# Patient Record
Sex: Female | Born: 1965 | Race: White | Hispanic: No | Smoking: Current every day smoker
Health system: Southern US, Community
[De-identification: ages and names within clinical notes are randomized; demographics above are authoritative.]

## PROBLEM LIST (undated history)

## (undated) DIAGNOSIS — G43909 Migraine, unspecified, not intractable, without status migrainosus: Secondary | ICD-10-CM

## (undated) HISTORY — PX: TUBAL LIGATION: SHX77

---

## 2014-07-04 ENCOUNTER — Emergency Department
Admission: EM | Admit: 2014-07-04 | Discharge: 2014-07-04 | Disposition: A | Discharge status: Home / Self Care | Payer: PRIVATE HEALTH INSURANCE | Source: Home / Self Care | Attending: Emergency Medicine | Admitting: Emergency Medicine

## 2014-07-04 ENCOUNTER — Emergency Department (INDEPENDENT_AMBULATORY_CARE_PROVIDER_SITE_OTHER): Payer: PRIVATE HEALTH INSURANCE

## 2014-07-04 ENCOUNTER — Encounter: Payer: Self-pay | Admitting: *Deleted

## 2014-07-04 DIAGNOSIS — S8391XA Sprain of unspecified site of right knee, initial encounter: Secondary | ICD-10-CM | POA: Diagnosis not present

## 2014-07-04 DIAGNOSIS — M25561 Pain in right knee: Secondary | ICD-10-CM

## 2014-07-04 MED ORDER — ONDANSETRON HCL 4 MG PO TABS: 4.0000 mg | ORAL_TABLET | Freq: Once | ORAL | Status: DC

## 2014-07-04 NOTE — ED Notes (Signed)
Pt has previous tear in R knee 15 years ago.  Yesterday she was walking and felt a pop in R knee and has pain bearing weight.  Pain 7/10.  She has an old knee brace and is using crutches.

## 2014-07-04 NOTE — ED Provider Notes (Signed)
CSN: [id number]     Arrival date & time 07/04/14  1248 History   First MD Initiated Contact with Patient 07/04/14 1300     Chief Complaint  Patient presents with  . Knee Pain    R   Here with husband. 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 They present to [facility] urgent care  Sunday afternoon HPI She states she's had intermittent right knee pain for years. Then, while walking last week felt increased right knee pain diffusely but she continued her regular activity. Then, while walking, felt a pop and sharp pain right medial knee associated with swelling. She's tried knee brace and ice. Still painful. Husband brings her in. The right knee pain is medial, sharp, 7 out of 10 with attempts at weightbearing and movement. No radiation or paresthesias. No cardiorespiratory symptoms. History reviewed. No pertinent past medical history. Past Surgical History  Procedure Laterality Date  . Tubal ligation     History reviewed. No pertinent family history. History  Substance Use Topics  . Smoking status: Current Every Day Smoker -- 0.50 packs/day    Types: Cigarettes  . Smokeless tobacco: Current User  . Alcohol Use: Yes   OB History    No data available     Review of Systems  All other systems reviewed and are negative.   Allergies  Penicillins  Home Medications   Prior to Admission medications   Not on File   BP 105/67 mmHg  Pulse 69  Temp(Src) 97.4 F (36.3 C) (Oral)  Ht  (1.702 m)  Wt 160 lb (72.576 kg)  BMI 25.05 kg/m2  SpO2 97% Physical Exam  Constitutional: She is oriented to person, place, and time. She appears well-developed and well-nourished. No distress.  HENT:  Head: Normocephalic and atraumatic.  Eyes: Conjunctivae and EOM are normal. Pupils are equal, round, and reactive to light. No scleral icterus.  Neck: Normal range of motion.  Cardiovascular: Normal rate.   Pulmonary/Chest: Effort normal.  Abdominal: She exhibits no  distension.  Musculoskeletal:  See below  Neurological: She is alert and oriented to person, place, and time.  Skin: Skin is warm.  Psychiatric: She has a normal mood and affect.  Nursing note and vitals reviewed.  Musculoskeletal: Very tender swollen right knee medial aspect, mild ecchymosis. Decreased range of motion. The knee is too tender and too much pain to adequately check for stability. anterior drawer sign equivocal. Unable to perform McMurray's because of pain. Neurovascular distally intact. ED Course  Procedures (including critical care time) Labs Review Labs Reviewed - No data to display  Imaging Review Dg Knee Complete 4 Views Right  07/04/2014   CLINICAL DATA:  49 year old female with right knee pain and decreased mobility after spontaneous pop. No known injury.  EXAM: RIGHT KNEE - COMPLETE 4+ VIEW  COMPARISON:  None.  FINDINGS: There is no evidence of fracture, dislocation, or joint effusion. There is no evidence of arthropathy or other focal bone abnormality. Soft tissues are unremarkable.  IMPRESSION: Negative.   Electronically Signed   By: Malachy Moan M.D.   On: 07/04/2014 14:34     MDM   1. Sprain of right knee, initial encounter    severe sprain right knee. Could be medial collateral ligament sprain or tear. Possible medial meniscus sprain or tear. Explained to patient and husband at length. Questions invited and answered. Treatment options discussed, as well as risks, benefits, alternatives. They voiced understanding and agreement with the following plans:  She declined  prescription pain med, as she prefers Tylenol or ibuprofen New Crutches provided.--No weightbearing right knee until see orthopedist this week She has a supportive right knee brace that she brings in, so advised her to continue with the right knee brace until she sees orthopedist this week. Patient and husband voiced understanding and agreement Precautions discussed. Red flags  discussed. Questions invited and answered. They voiced understanding and agreement.  Addendum, patient mentions that she took a tramadol that she had at home and that relieved the pain somewhat, however she feels that cause nausea. Therefore, we treated her with Zofran ODT 8 mg SL stat here in urgent care. That relieved her nausea . She declined a prescription for Zofran.    Lajean Manesavid Massey, MD 07/04/14 2220

## 2015-04-05 ENCOUNTER — Emergency Department (INDEPENDENT_AMBULATORY_CARE_PROVIDER_SITE_OTHER): Payer: BLUE CROSS/BLUE SHIELD

## 2015-04-05 ENCOUNTER — Encounter: Payer: Self-pay | Admitting: *Deleted

## 2015-04-05 ENCOUNTER — Emergency Department
Admission: EM | Admit: 2015-04-05 | Discharge: 2015-04-05 | Disposition: A | Discharge status: Home / Self Care | Payer: BLUE CROSS/BLUE SHIELD | Source: Home / Self Care | Attending: Family Medicine | Admitting: Family Medicine

## 2015-04-05 DIAGNOSIS — R51 Headache: Secondary | ICD-10-CM | POA: Diagnosis not present

## 2015-04-05 DIAGNOSIS — J3489 Other specified disorders of nose and nasal sinuses: Secondary | ICD-10-CM

## 2015-04-05 DIAGNOSIS — R519 Headache, unspecified: Secondary | ICD-10-CM

## 2015-04-05 DIAGNOSIS — R110 Nausea: Secondary | ICD-10-CM

## 2015-04-05 HISTORY — DX: Migraine, unspecified, not intractable, without status migrainosus: G43.909

## 2015-04-05 LAB — POCT CBC W AUTO DIFF (K'VILLE URGENT CARE)

## 2015-04-05 MED ORDER — KETOROLAC TROMETHAMINE 60 MG/2ML IM SOLN
60.0000 mg | Freq: Once | INTRAMUSCULAR | Status: AC
  Administered 2015-04-05: 60 mg via INTRAMUSCULAR

## 2015-04-05 MED ORDER — CLINDAMYCIN HCL 300 MG PO CAPS: 300.0000 mg | ORAL_CAPSULE | Freq: Three times a day (TID) | ORAL | Status: AC

## 2015-04-05 MED ORDER — ONDANSETRON 4 MG PO TBDP
4.0000 mg | ORAL_TABLET | Freq: Once | ORAL | Status: AC
  Administered 2015-04-05: 4 mg via ORAL

## 2015-04-05 MED ORDER — ONDANSETRON 4 MG PO TBDP: ORAL_TABLET | ORAL | Status: AC

## 2015-04-05 NOTE — Discharge Instructions (Signed)
May take Ibuprofen 200mg , 4 tabs every 8 hours with food as needed for pain. If cold like symptoms develop, try the following: Take plain guaifenesin (1200mg  extended release tabs such as Mucinex) twice daily, with plenty of water, for cough and congestion.  May add Pseudoephedrine (30mg , one or two every 4 to 6 hours) for sinus congestion.  Get adequate rest.   May use Afrin nasal spray (or generic oxymetazoline) twice daily for about 5 days and then discontinue.  Also recommend using saline nasal spray several times daily and saline nasal irrigation (AYR is a common brand).   Try warm salt water gargles for sore throat.  Stop all antihistamines for now, and other non-prescription cough/cold preparations. May take Delsym Cough Suppressant at bedtime for nighttime cough.  Follow-up with family doctor if not improving about10 days.  If symptoms become significantly worse during the night or over the weekend, proceed to the local emergency room.

## 2015-04-05 NOTE — ED Notes (Signed)
Pt c/o LT sided HA and post nasal drip x 4 days. Hx of migraine and sinusitis. She started Amoxicillin on 04/02/15. She reports Tylenol sinus helps.

## 2015-04-05 NOTE — ED Provider Notes (Signed)
CSN: [id number]     Arrival date & time 04/05/15  1746 History   First MD Initiated Contact with Patient 04/05/15 1757     Chief Complaint  Patient presents with  . Headache      HPI Comments: Patient developed onset of recurrent typical migraine headache 5 days ago that has persisted.  She has also developed mild sore throat, fatigue, chills/sweats, and nausea.  She has had vague pain in her left upper teeth and gingiva.  She had left-over amoxicillin that she began taking 3 days ago without improvement.  The history is provided by the patient.    Past Medical History  Diagnosis Date  . Migraine    Past Surgical History  Procedure Laterality Date  . Tubal ligation    . Tubal ligation     History reviewed. No pertinent family history. Social History  Substance Use Topics  . Smoking status: Current Every Day Smoker -- 1.00 packs/day    Types: Cigarettes  . Smokeless tobacco: Current User  . Alcohol Use: Yes   OB History    No data available     Review of Systems No sore throat No cough + sneezing No pleuritic pain No wheezing + nasal congestion + post-nasal drainage + sinus pain/pressure No itchy/red eyes ? earache No hemoptysis No SOB No fever, + chills/sweats + nausea No vomiting No abdominal pain No diarrhea No urinary symptoms No skin rash + fatigue No myalgias + headache No neurologic symptoms  Used OTC meds without relief  Allergies  Penicillins  Home Medications   Prior to Admission medications   Medication Sig Start Date End Date Taking? Authorizing Provider  clindamycin (CLEOCIN) 300 MG capsule Take 1 capsule (300 mg total) by mouth 3 (three) times daily. 04/05/15   Lattie HawStephen A Allyana Vogan, MD  ondansetron (ZOFRAN ODT) 4 MG disintegrating tablet Take one tab by mouth Q6hr prn nausea 04/05/15   Lattie HawStephen A Savita Runner, MD   Meds Ordered and Administered this Visit   Medications  ketorolac (TORADOL) injection 60 mg (60 mg Intramuscular Given 04/05/15  1921)  ondansetron (ZOFRAN-ODT) disintegrating tablet 4 mg (4 mg Oral Given 04/05/15 1922)    BP 124/75 mmHg  Pulse 65  Temp(Src) 97.8 F (36.6 C) (Oral)  Resp 16  Ht 5\' 8"  (1.727 m)  Wt 164 lb (74.39 kg)  BMI 24.94 kg/m2  SpO2 100%  LMP 03/15/2015 No data found.   Physical Exam Nursing notes and Vital Signs reviewed. Appearance:  Patient appears uncomfortable and stated age, but in no acute distress Eyes:  Pupils are equal, round, and reactive to light and accomodation.  Extraocular movement is intact.  Conjunctivae are not inflamed.  Fundi benign  Ears:  Canals normal.  Tympanic membranes normal.  Nose:  Congested turbinates.  Left maxillary sinus tenderness is present.   Mouth:  Left upper 4th molar absent, and gingiva at that location has tenderness to palpation  Pharynx:  Normal Neck:  Supple.  Tender enlarged posterior nodes are palpated bilaterally  Lungs:  Clear to auscultation.  Breath sounds are equal.  Moving air well. Heart:  Regular rate and rhythm without murmurs, rubs, or gallops.  Abdomen:  Nontender without masses or hepatosplenomegaly.  Bowel sounds are present.  No CVA or flank tenderness.  Extremities:  No edema.  No calf tenderness Skin:  No rash present.  Neurologic:  Cranial nerves 2 through 12 are normal.  Patellar, achilles, and elbow reflexes are normal.  Cerebellar function is intact (finger-to-nose and  rapid alternating hand movement).  Gait and station are normal.     ED Course  Procedures none     Labs Reviewed  POCT CBC W AUTO DIFF ([facility]):  WBC 12.1; LY 23.6; MO 2.8; GR 73.6; Hgb 13.9; Platelets 222     Imaging Review Dg Sinuses Complete  04/05/2015  CLINICAL DATA:  Left-sided facial pain for 5 days EXAM: PARANASAL SINUSES - COMPLETE 3 + VIEW COMPARISON:  None. FINDINGS: Frontal, water's, lateral, and submentovertex images were obtained. Paranasal sinuses are clear. No air-fluid level. No bony destruction or expansion. Mastoids  also appear clear. There is slight leftward deviation of the nasal septum. IMPRESSION: Paranasal sinuses clear.  Slight leftward deviation of nasal septum. Electronically Signed   By: Bretta Bang III M.D.   On: 04/05/2015 18:45     MDM   1. Acute nonintractable headache, unspecified headache type; Note mild leukocytosis: ?dental source, ?early viral URI   2. Nausea without vomiting    Administered Toradol  IM, and Zofran ODT  po Begin empiric Clindamycin  TID for dental coverage. Followup with dentist in about two days. May take Ibuprofen , 4 tabs every 8 hours with food as needed for pain. If cold like symptoms develop, try the following: Take plain guaifenesin (  extended release tabs such as Mucinex) twice daily, with plenty of water, for cough and congestion.  May add Pseudoephedrine ( , one or two every 4 to 6 hours) for sinus congestion.  Get adequate rest.   May use Afrin nasal spray (or generic oxymetazoline) twice daily for about 5 days and then discontinue.  Also recommend using saline nasal spray several times daily and saline nasal irrigation (AYR is a common brand).   Try warm salt water gargles for sore throat.  Stop all antihistamines for now, and other non-prescription cough/cold preparations. May take Delsym Cough Suppressant at bedtime for nighttime cough.  Follow-up with family doctor if not improving about10 days.  If symptoms become significantly worse during the night or over the weekend, proceed to the local emergency room.     Lattie Haw, MD 04/13/15 1048

## 2015-10-07 ENCOUNTER — Emergency Department
Admission: EM | Admit: 2015-10-07 | Discharge: 2015-10-07 | Disposition: A | Discharge status: Home / Self Care | Payer: BLUE CROSS/BLUE SHIELD | Source: Home / Self Care

## 2015-10-07 ENCOUNTER — Encounter: Payer: Self-pay | Admitting: Emergency Medicine

## 2015-10-07 ENCOUNTER — Emergency Department (INDEPENDENT_AMBULATORY_CARE_PROVIDER_SITE_OTHER): Payer: BLUE CROSS/BLUE SHIELD

## 2015-10-07 DIAGNOSIS — M4180 Other forms of scoliosis, site unspecified: Secondary | ICD-10-CM | POA: Diagnosis not present

## 2015-10-07 DIAGNOSIS — M5136 Other intervertebral disc degeneration, lumbar region: Secondary | ICD-10-CM

## 2015-10-07 DIAGNOSIS — M545 Low back pain, unspecified: Secondary | ICD-10-CM

## 2015-10-07 LAB — POCT URINALYSIS DIP (MANUAL ENTRY)
GLUCOSE UA: NEGATIVE
NITRITE UA: NEGATIVE
PH UA: 5.5 (ref 5–8)
Protein Ur, POC: 30 — AB
Urobilinogen, UA: 1 (ref 0–1)

## 2015-10-07 MED ORDER — METHYLPREDNISOLONE SODIUM SUCC 125 MG IJ SOLR
80.0000 mg | Freq: Once | INTRAMUSCULAR | Status: AC
  Administered 2015-10-07: 80 mg via INTRAMUSCULAR

## 2015-10-07 MED ORDER — PREDNISONE 20 MG PO TABS: ORAL_TABLET | ORAL | Status: AC

## 2015-10-07 MED ORDER — ONDANSETRON HCL 4 MG PO TABS
4.0000 mg | ORAL_TABLET | Freq: Once | ORAL | Status: AC
  Administered 2015-10-07: 4 mg via ORAL

## 2015-10-07 MED ORDER — HYDROCODONE-ACETAMINOPHEN 5-325 MG PO TABS: 1.0000 | ORAL_TABLET | Freq: Four times a day (QID) | ORAL | Status: AC | PRN

## 2015-10-07 NOTE — ED Notes (Addendum)
Pt c/o back pain that is only on the left side. Started suddenly this am. No known injury. Denies urinary sxs but states some intermittent nausea.

## 2015-10-07 NOTE — Discharge Instructions (Signed)
Apply ice pack for 20 to 30 minutes, 3 to 4 times daily  Continue until pain decreases.  Begin back exercises as tolerated   Back Pain, Adult Back pain is very common in adults.The cause of back pain is rarely dangerous and the pain often gets better over time.The cause of your back pain may not be known. Some common causes of back pain include: 1. Strain of the muscles or ligaments supporting the spine. 2. Wear and tear (degeneration) of the spinal disks. 3. Arthritis. 4. Direct injury to the back. For many people, back pain may return. Since back pain is rarely dangerous, most people can learn to manage this condition on their own. HOME CARE INSTRUCTIONS Watch your back pain for any changes. The following actions may help to lessen any discomfort you are feeling: 1. Remain active. It is stressful on your back to sit or stand in one place for long periods of time. Do not sit, drive, or stand in one place for more than 30 minutes at a time. Take short walks on even surfaces as soon as you are able.Try to increase the length of time you walk each day. 2. Exercise regularly as directed by your health care provider. Exercise helps your back heal faster. It also helps avoid future injury by keeping your muscles strong and flexible. 3. Do not stay in bed.Resting more than 1-2 days can delay your recovery. 4. Pay attention to your body when you bend and lift. The most comfortable positions are those that put less stress on your recovering back. Always use proper lifting techniques, including: 1. Bending your knees. 2. Keeping the load close to your body. 3. Avoiding twisting. 5. Find a comfortable position to sleep. Use a firm mattress and lie on your side with your knees slightly bent. If you lie on your back, put a pillow under your knees. 6. Avoid feeling anxious or stressed.Stress increases muscle tension and can worsen back pain.It is important to recognize when you are anxious or stressed  and learn ways to manage it, such as with exercise. 7. Take medicines only as directed by your health care provider. Over-the-counter medicines to reduce pain and inflammation are often the most helpful.Your health care provider may prescribe muscle relaxant drugs.These medicines help dull your pain so you can more quickly return to your normal activities and healthy exercise. 8. Apply ice to the injured area: 1. Put ice in a plastic bag. 2. Place a towel between your skin and the bag. 3. Leave the ice on for 20 minutes, 2-3 times a day for the first 2-3 days. After that, ice and heat may be alternated to reduce pain and spasms. 9. Maintain a healthy weight. Excess weight puts extra stress on your back and makes it difficult to maintain good posture. SEEK MEDICAL CARE IF: 1. You have pain that is not relieved with rest or medicine. 2. You have increasing pain going down into the legs or buttocks. 3. You have pain that does not improve in one week. 4. You have night pain. 5. You lose weight. 6. You have a fever or chills. SEEK IMMEDIATE MEDICAL CARE IF:  1. You develop new bowel or bladder control problems. 2. You have unusual weakness or numbness in your arms or legs. 3. You develop nausea or vomiting. 4. You develop abdominal pain. 5. You feel faint.   This information is not intended to replace advice given to you by your health care provider. Make sure you discuss  any questions you have with your health care provider.   Document Released: 04/02/2005 Document Revised: 04/23/2014 Document Reviewed: 08/04/2013 Elsevier Interactive Patient Education 2016 [employer]   Back Exercises The following exercises strengthen the muscles that help to support the back. They also help to keep the lower back flexible. Doing these exercises can help to prevent back pain or lessen existing pain. If you have back pain or discomfort, try doing these exercises 2-3 times each day or as told by your  health care provider. When the pain goes away, do them once each day, but increase the number of times that you repeat the steps for each exercise (do more repetitions). If you do not have back pain or discomfort, do these exercises once each day or as told by your health care provider. EXERCISES Single Knee to Chest Repeat these steps 3-5 times for each leg: 5. Lie on your back on a firm bed or the floor with your legs extended. 6. Bring one knee to your chest. Your other leg should stay extended and in contact with the floor. 7. Hold your knee in place by grabbing your knee or thigh. 8. Pull on your knee until you feel a gentle stretch in your lower back. 9. Hold the stretch for 10-30 seconds. 10. Slowly release and straighten your leg. Pelvic Tilt Repeat these steps 5-10 times: 10. Lie on your back on a firm bed or the floor with your legs extended. 11. Bend your knees so they are pointing toward the ceiling and your feet are flat on the floor. 12. Tighten your lower abdominal muscles to press your lower back against the floor. This motion will tilt your pelvis so your tailbone points up toward the ceiling instead of pointing to your feet or the floor. 13. With gentle tension and even breathing, hold this position for 5-10 seconds. Cat-Cow Repeat these steps until your lower back becomes more flexible: 7. Get into a hands-and-knees position on a firm surface. Keep your hands under your shoulders, and keep your knees under your hips. You may place padding under your knees for comfort. 8. Let your head hang down, and point your tailbone toward the floor so your lower back becomes rounded like the back of a cat. 9. Hold this position for 5 seconds. 10. Slowly lift your head and point your tailbone up toward the ceiling so your back forms a sagging arch like the back of a cow. 11. Hold this position for 5 seconds. Press-Ups Repeat these steps 5-10 times: 6. Lie on your abdomen (face-down) on  the floor. 7. Place your palms near your head, about shoulder-width apart. 8. While you keep your back as relaxed as possible and keep your hips on the floor, slowly straighten your arms to raise the top half of your body and lift your shoulders. Do not use your back muscles to raise your upper torso. You may adjust the placement of your hands to make yourself more comfortable. 9. Hold this position for 5 seconds while you keep your back relaxed. 10. Slowly return to lying flat on the floor. Bridges Repeat these steps 10 times: 1. Lie on your back on a firm surface. 2. Bend your knees so they are pointing toward the ceiling and your feet are flat on the floor. 3. Tighten your buttocks muscles and lift your buttocks off of the floor until your waist is at almost the same height as your knees. You should feel the muscles working in your  buttocks and the back of your thighs. If you do not feel these muscles, slide your feet 1-2 inches farther away from your buttocks. 4. Hold this position for 3-5 seconds. 5. Slowly lower your hips to the starting position, and allow your buttocks muscles to relax completely. If this exercise is too easy, try doing it with your arms crossed over your chest. Abdominal Crunches Repeat these steps 5-10 times: 1. Lie on your back on a firm bed or the floor with your legs extended. 2. Bend your knees so they are pointing toward the ceiling and your feet are flat on the floor. 3. Cross your arms over your chest. 4. Tip your chin slightly toward your chest without bending your neck. 5. Tighten your abdominal muscles and slowly raise your trunk (torso) high enough to lift your shoulder blades a tiny bit off of the floor. Avoid raising your torso higher than that, because it can put too much stress on your low back and it does not help to strengthen your abdominal muscles. 6. Slowly return to your starting position. Back Lifts Repeat these steps 5-10 times: 1. Lie on your  abdomen (face-down) with your arms at your sides, and rest your forehead on the floor. 2. Tighten the muscles in your legs and your buttocks. 3. Slowly lift your chest off of the floor while you keep your hips pressed to the floor. Keep the back of your head in line with the curve in your back. Your eyes should be looking at the floor. 4. Hold this position for 3-5 seconds. 5. Slowly return to your starting position. SEEK MEDICAL CARE IF:  Your back pain or discomfort gets much worse when you do an exercise.  Your back pain or discomfort does not lessen within 2 hours after you exercise. If you have any of these problems, stop doing these exercises right away. Do not do them again unless your health care provider says that you can. SEEK IMMEDIATE MEDICAL CARE IF:  You develop sudden, severe back pain. If this happens, stop doing the exercises right away. Do not do them again unless your health care provider says that you can.   This information is not intended to replace advice given to you by your health care provider. Make sure you discuss any questions you have with your health care provider.   Document Released: 05/10/2004 Document Revised: 12/22/2014 Document Reviewed: 05/27/2014 Elsevier Interactive Patient Education [employer]2016 [employer]

## 2015-10-07 NOTE — ED Provider Notes (Signed)
CSN: [id number]     Arrival date & time 10/07/15  1316 History   None    Chief Complaint  Patient presents with  . Back Pain      HPI Comments: While sitting in a chair at 11am today, patient suddenly developed sharp left back pain that has persisted.  The pain does not radiate.  The pain is worse when sitting, and decreased when she stands.   She denies bowel or bladder dysfunction, and no saddle numbness.  No urinary symptoms.  No fevers, chills, and sweats.    Patient is a 50 y.o. female presenting with back pain. The history is provided by the patient.  Back Pain Location:  Lumbar spine Quality:  Shooting and stabbing Pain severity:  Moderate Pain is:  Same all the time Onset quality:  Sudden Duration:  2 hours Timing:  Constant Progression:  Worsening Chronicity:  New Context: not lifting heavy objects, not occupational injury, not recent illness, not recent injury and not twisting   Relieved by:  None tried Worsened by:  Sitting Ineffective treatments:  None tried Associated symptoms: no abdominal pain, no abdominal swelling, no bladder incontinence, no bowel incontinence, no chest pain, no dysuria, no fever, no leg pain, no numbness, no paresthesias, no pelvic pain, no perianal numbness, no tingling, no weakness and no weight loss     Past Medical History  Diagnosis Date  . Migraine    Past Surgical History  Procedure Laterality Date  . Tubal ligation    . Tubal ligation     History reviewed. No pertinent family history. Social History  Substance Use Topics  . Smoking status: Current Every Day Smoker -- 1.00 packs/day    Types: Cigarettes  . Smokeless tobacco: Current User  . Alcohol Use: Yes   OB History    No data available     Review of Systems  Constitutional: Negative for fever and weight loss.  Cardiovascular: Negative for chest pain.  Gastrointestinal: Negative for abdominal pain and bowel incontinence.  Genitourinary: Negative for bladder  incontinence, dysuria and pelvic pain.  Musculoskeletal: Positive for back pain.  Neurological: Negative for tingling, weakness, numbness and paresthesias.  All other systems reviewed and are negative.   Allergies  Penicillins  Home Medications   Prior to Admission medications   Medication Sig Start Date End Date Taking? Authorizing Provider  clindamycin (CLEOCIN) 300 MG capsule Take 1 capsule (300 mg total) by mouth 3 (three) times daily. 04/05/15   Lattie HawStephen A Beese, MD  HYDROcodone-acetaminophen (NORCO/VICODIN) 5-325 MG tablet Take 1 tablet by mouth every 6 (six) hours as needed for moderate pain. 10/07/15   Lattie HawStephen A Beese, MD  ondansetron (ZOFRAN ODT) 4 MG disintegrating tablet Take one tab by mouth Q6hr prn nausea 04/05/15   Lattie HawStephen A Beese, MD  predniSONE (DELTASONE) 20 MG tablet Take one tab by mouth twice daily for 4 days, then one daily for 3 days. Take with food. 10/07/15   Lattie HawStephen A Beese, MD   Meds Ordered and Administered this Visit   Medications  methylPREDNISolone sodium succinate (SOLU-MEDROL) 125 mg/2 mL injection 80 mg (not administered)  ondansetron (ZOFRAN) tablet 4 mg (4 mg Oral Given 10/07/15 1432)    BP 106/74 mmHg  Pulse 79  Temp(Src) 97.6 F (36.4 C) (Oral)  Wt 161 lb (73.029 kg)  SpO2 99%  LMP 09/16/2015 No data found.   Physical Exam  Constitutional: She is oriented to person, place, and time. She appears well-developed and well-nourished. No distress.  HENT:  Head: Normocephalic.  Nose: Nose normal.  Mouth/Throat: Oropharynx is clear and moist.  Eyes: Conjunctivae are normal. Pupils are equal, round, and reactive to light.  Neck: Neck supple.  Cardiovascular: Normal heart sounds.   Pulmonary/Chest: Breath sounds normal.  Abdominal: There is no tenderness.  Musculoskeletal: She exhibits no edema.       Lumbar back: She exhibits decreased range of motion. She exhibits no bony tenderness and no swelling.       Back:  Back:  Decreased range of  motion.  Can heel/toe walk and squat without difficulty. Tenderness in the right paraspinous muscles from L5 to S1.  Straight leg raising test is negative.  Sitting knee extension test is negative.  Strength and sensation in the lower extremities is normal.  Patellar and achilles reflexes are normal.  Note that she has pain with flexion, extension, and rotation of the left hip.     Lymphadenopathy:    She has no cervical adenopathy.  Neurological: She is alert and oriented to person, place, and time.  Skin: Skin is warm and dry. No rash noted.  Nursing note and vitals reviewed.   ED Course  Procedures none    Labs Reviewed  POCT URINALYSIS DIP (MANUAL ENTRY) - Abnormal; Notable for the following:    Clarity, UA cloudy (*)    Bilirubin, UA small (*)    Ketones, POC UA trace (5) (*)    Blood, UA trace-intact (*)    Protein Ur, POC trace (*)    Leukocytes, UA Trace (*)    All other components within normal limits  URINE CULTURE    Imaging Review Dg Lumbar Spine Complete  10/07/2015  CLINICAL DATA:  Sudden onset left lower back pain today EXAM: LUMBAR SPINE - COMPLETE 4+ VIEW COMPARISON:  None. FINDINGS: Mild levoscoliosis. Mild L5-S1 facet arthropathy. Mild L3-4 degenerative disc disease. IMPRESSION: No acute findings.  Mild degenerative change. Electronically Signed   By: Esperanza Heiraymond  Rubner M.D.   On: 10/07/2015 15:22     MDM   1. Left-sided low back pain without sciatica    Doubt UTI, but will obtain urine culture Solumedrol 80mg  IM Begin prednisone burst/taper tomorrow.  Lortab for pain Apply ice pack for 20 to 30 minutes, 3 to 4 times daily  Continue until pain decreases.  Begin back exercises as tolerated Followup with Dr. Rodney Langtonhomas Thekkekandam or Dr. Clementeen GrahamEvan Corey ([facility]) if not improving about two weeks.     Lattie HawStephen A Beese, MD 10/15/15 [phone]

## 2015-10-08 ENCOUNTER — Telehealth: Payer: Self-pay | Admitting: Emergency Medicine

## 2015-10-08 NOTE — ED Notes (Signed)
Patient still having low back pain and some low abdominal pain, despite prednisone injection yesterday; wonders if UA might indicated UTI and if that should be treated; told her Urine culture not back yet and will let Dr.Beese know her concerns.

## 2015-10-08 NOTE — Treatment Plan (Signed)
Will begin empiric Cipro 500mg  BID (Rx #10, no refill) while culture pending

## 2015-10-09 LAB — URINE CULTURE
COLONY COUNT: NO GROWTH
Organism ID, Bacteria: NO GROWTH

## 2015-10-10 ENCOUNTER — Telehealth: Payer: Self-pay | Admitting: *Deleted

## 2015-10-10 NOTE — ED Notes (Unsigned)
Spoke to pt given Ucx results. She reports that she is feeling minimally better. She did not start the ABT until 10/09/15. Advised her to continue ABT and if no better tomorrow to call back, f/u with sports med or PCP. Pt agrees. Clemens Catholichristy Anastasia Tompson, LPN

## 2016-03-26 IMAGING — CR DG KNEE COMPLETE 4+V*R*
4 series · 4 of 4 positions shown · non-contrast
Comparison: None.

CLINICAL DATA: 48-year-old female with right knee pain and
decreased mobility after spontaneous pop. No known injury.

EXAM:
RIGHT KNEE - COMPLETE 4+ VIEW

[knee ap]
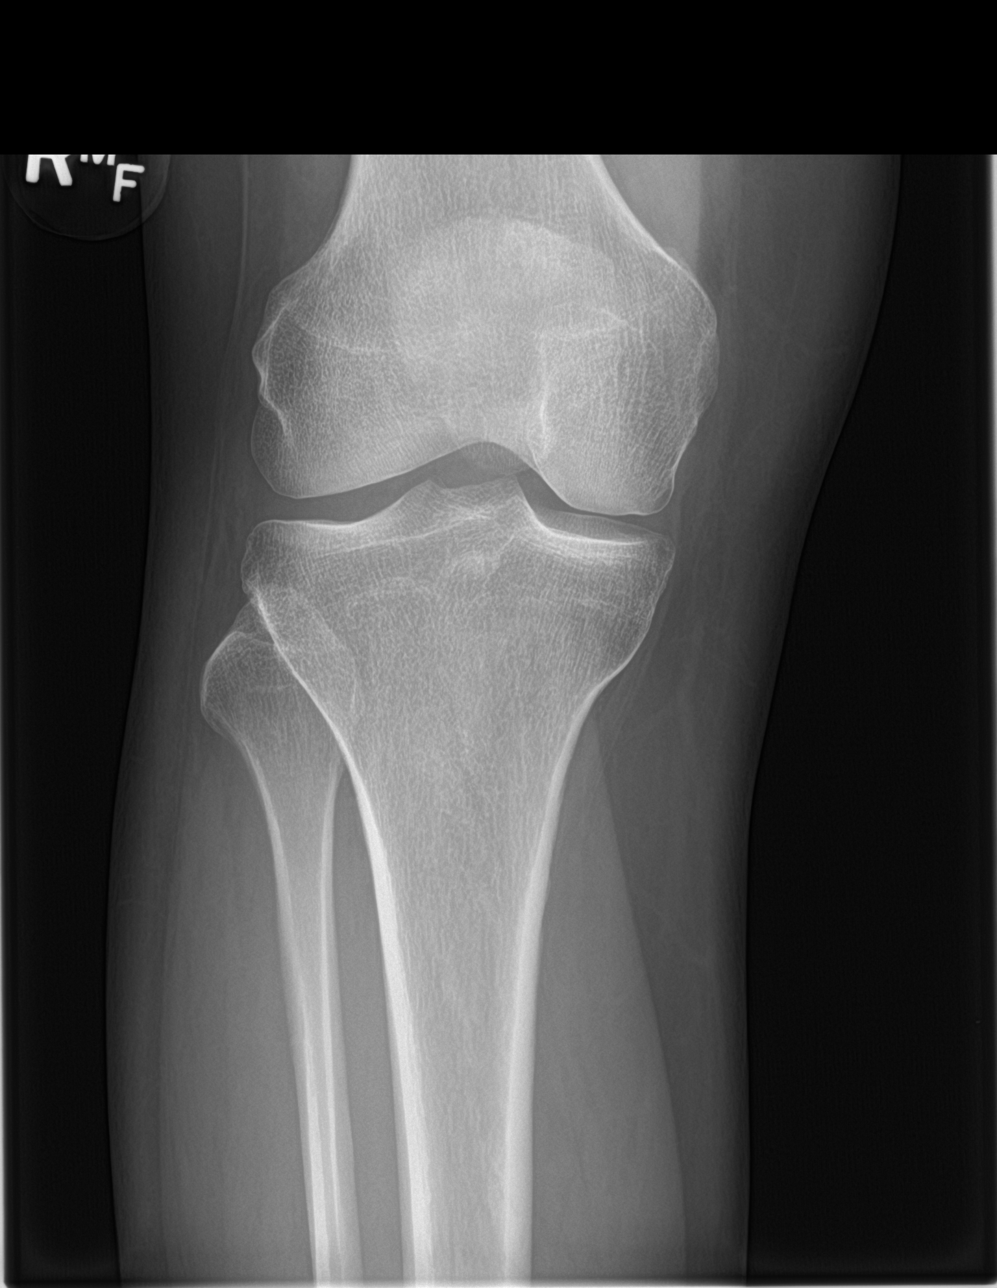

[knee obl (1 of 2)]
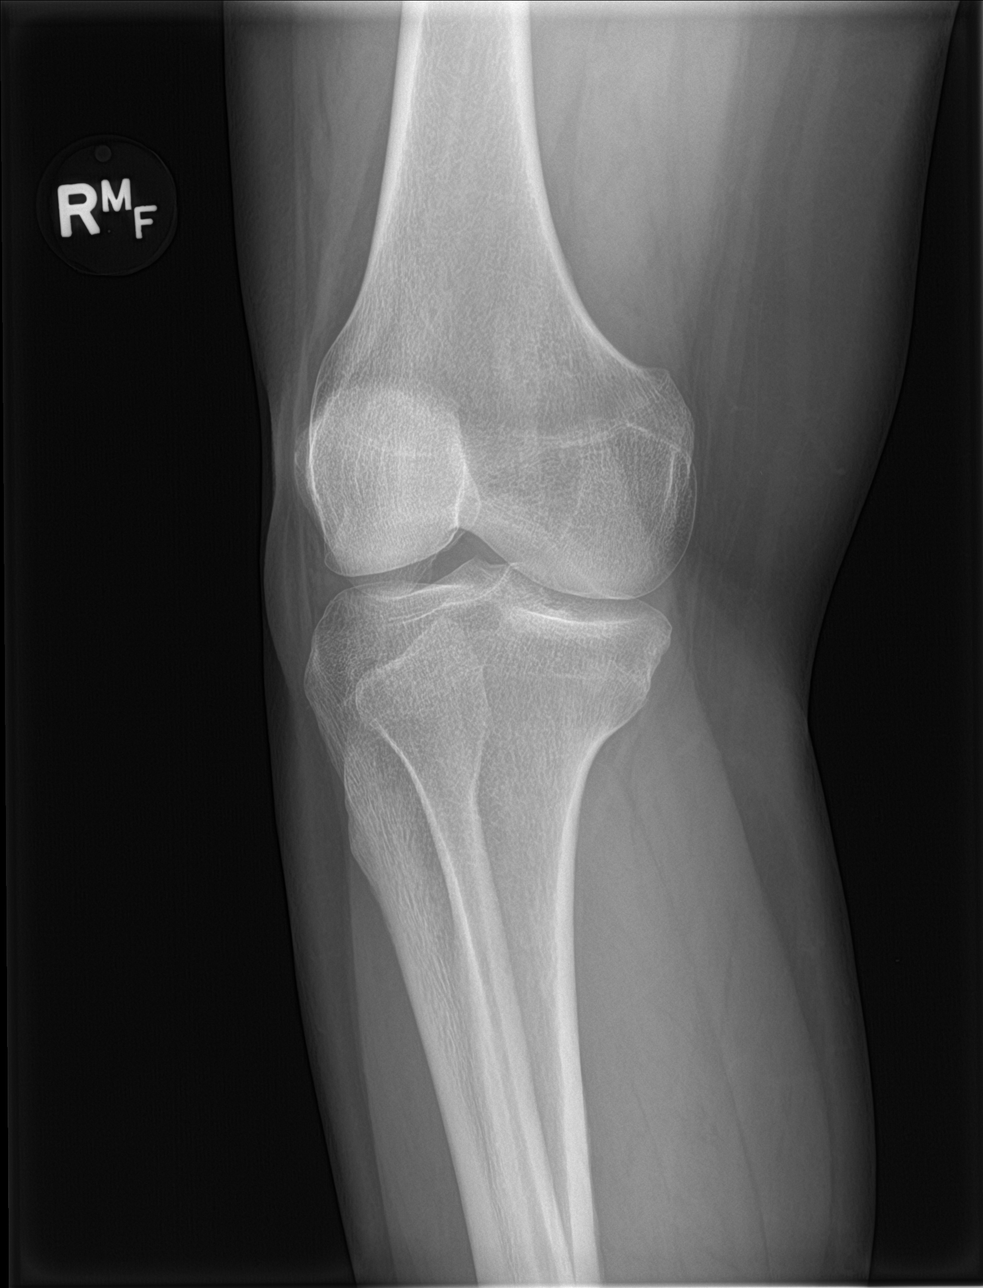

[knee obl (2 of 2)]
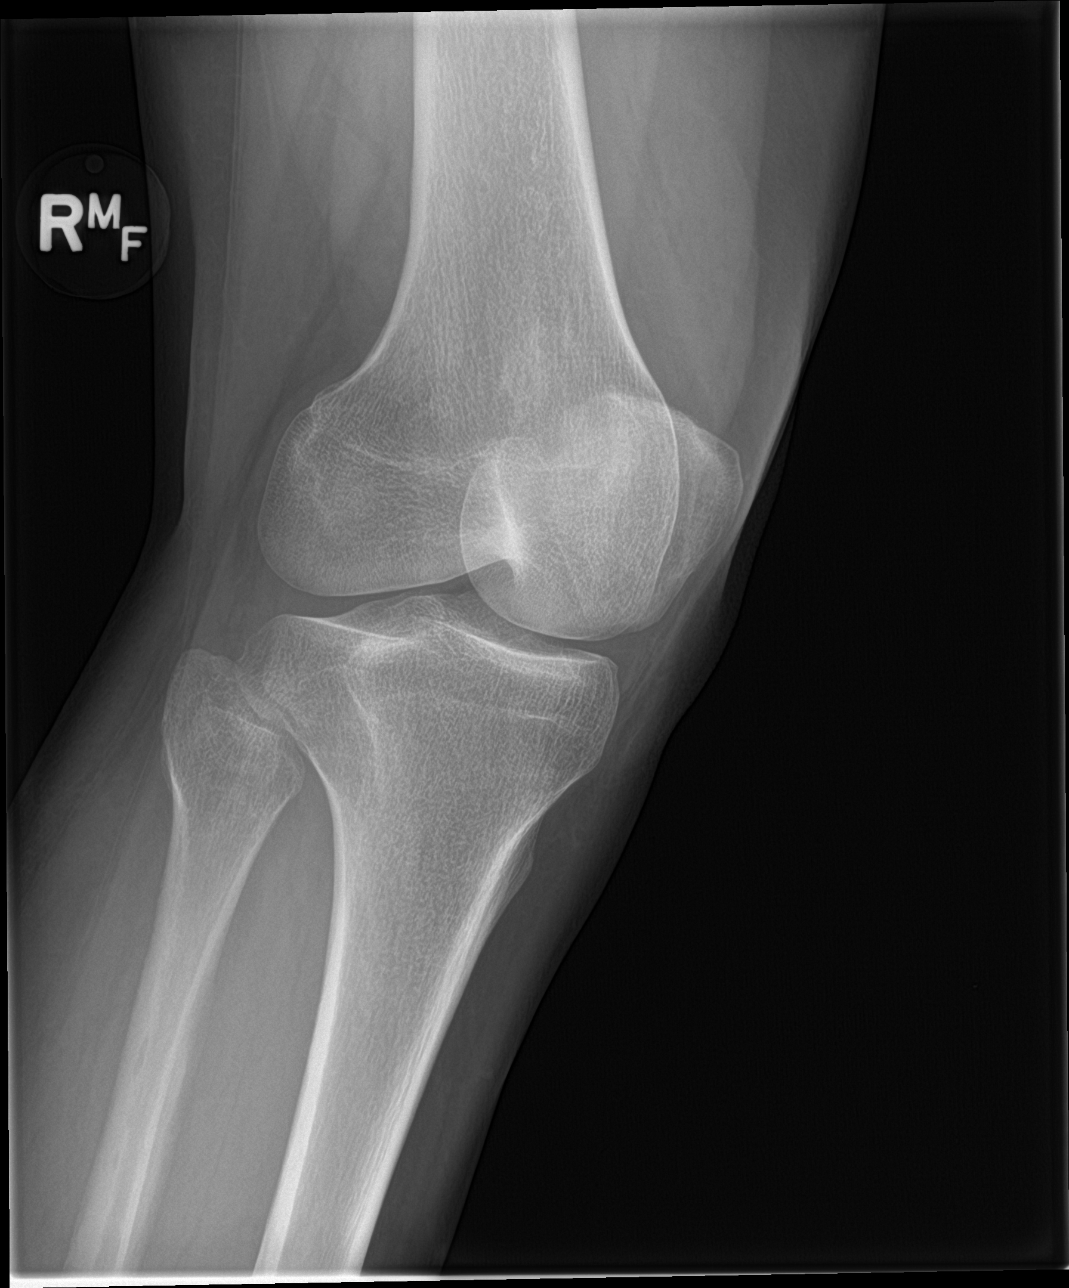

[knee lat]
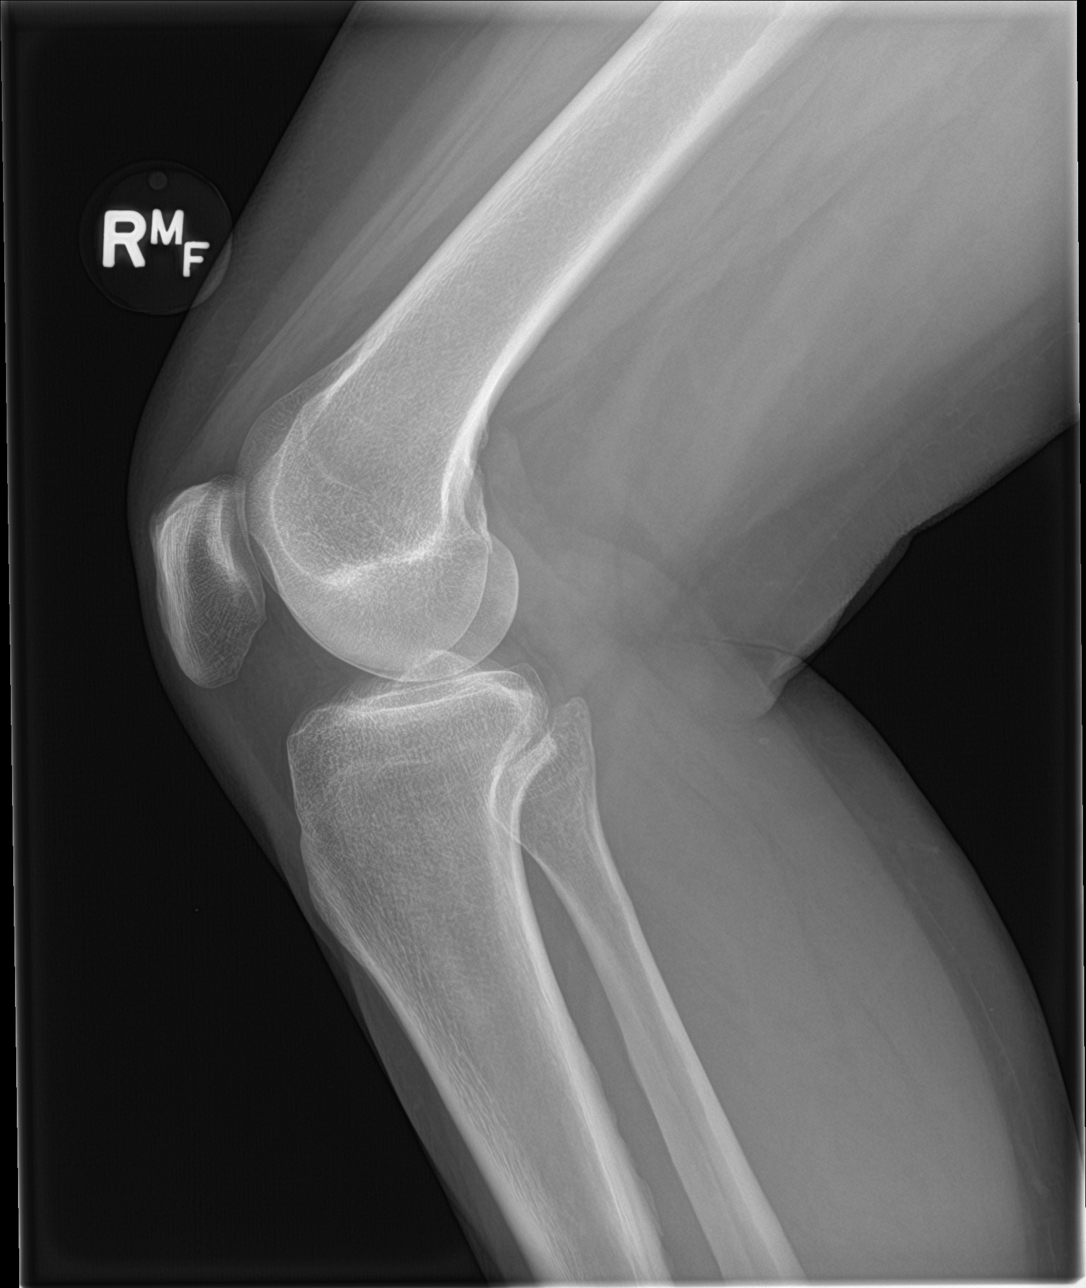

[4 of 4 positions shown; findings below may reference images not displayed]

FINDINGS: There is no evidence of fracture, dislocation, or joint effusion.
There is no evidence of arthropathy or other focal bone abnormality.
Soft tissues are unremarkable.
IMPRESSION: Negative.

## 2016-12-26 IMAGING — CR DG SINUSES COMPLETE 3+V
4 series · 4 of 4 positions shown · non-contrast
Comparison: None.

CLINICAL DATA: Left-sided facial pain for 5 days

EXAM:
PARANASAL SINUSES - COMPLETE 3 + VIEW

[[person_name] (1 of 3)]
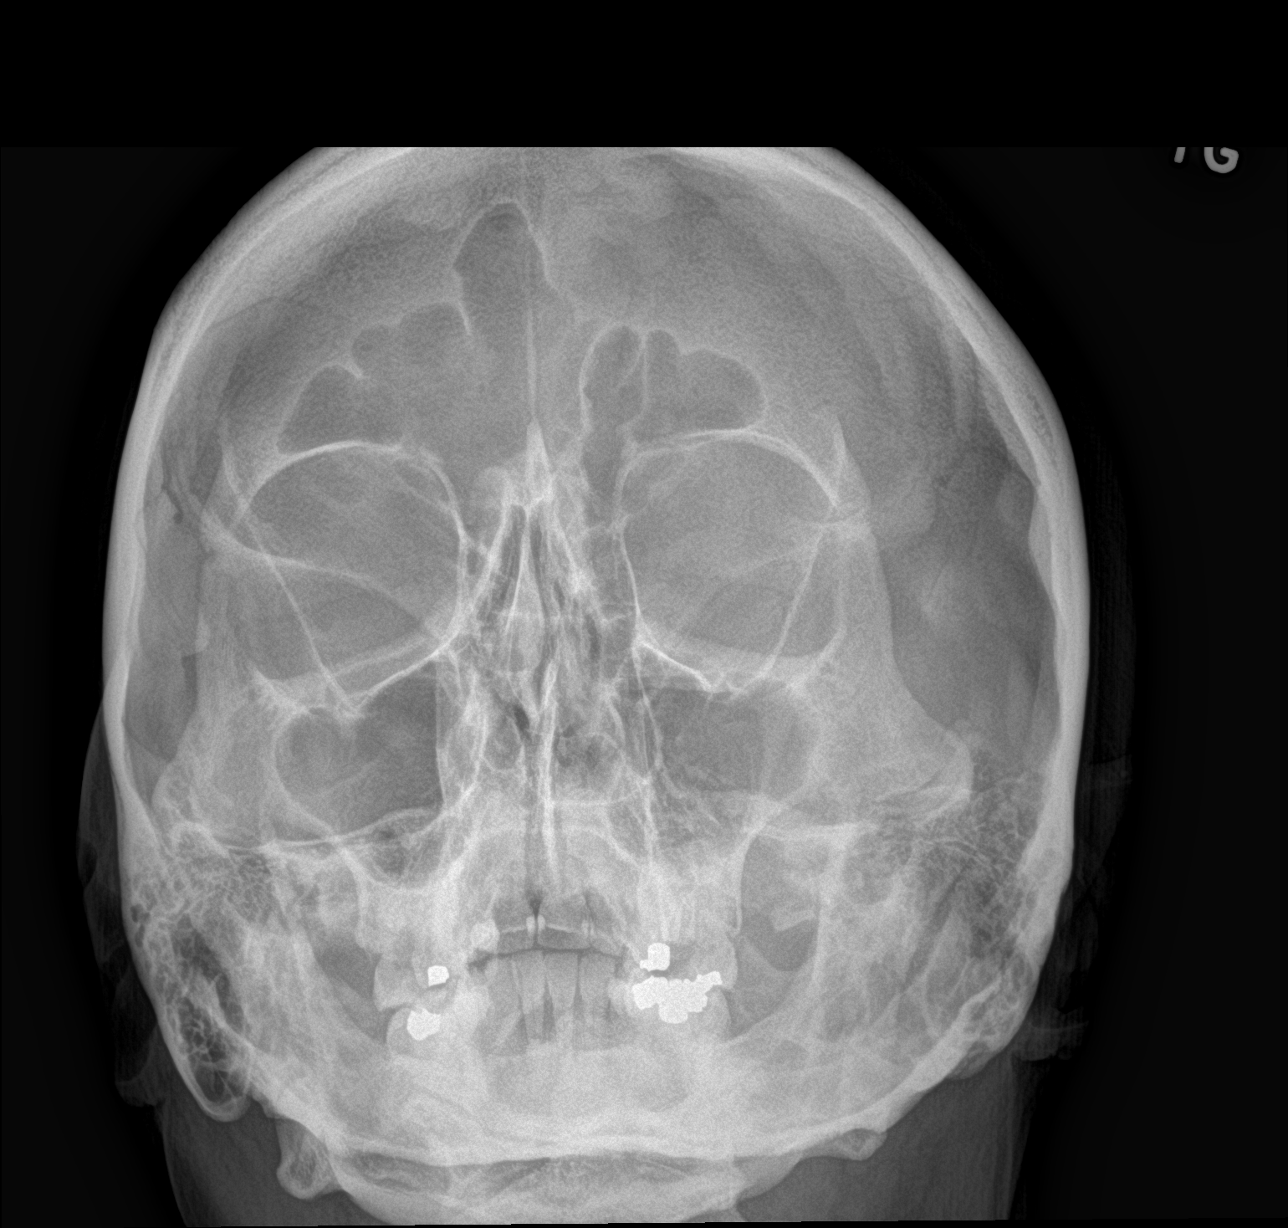

[[person_name] (2 of 3)]
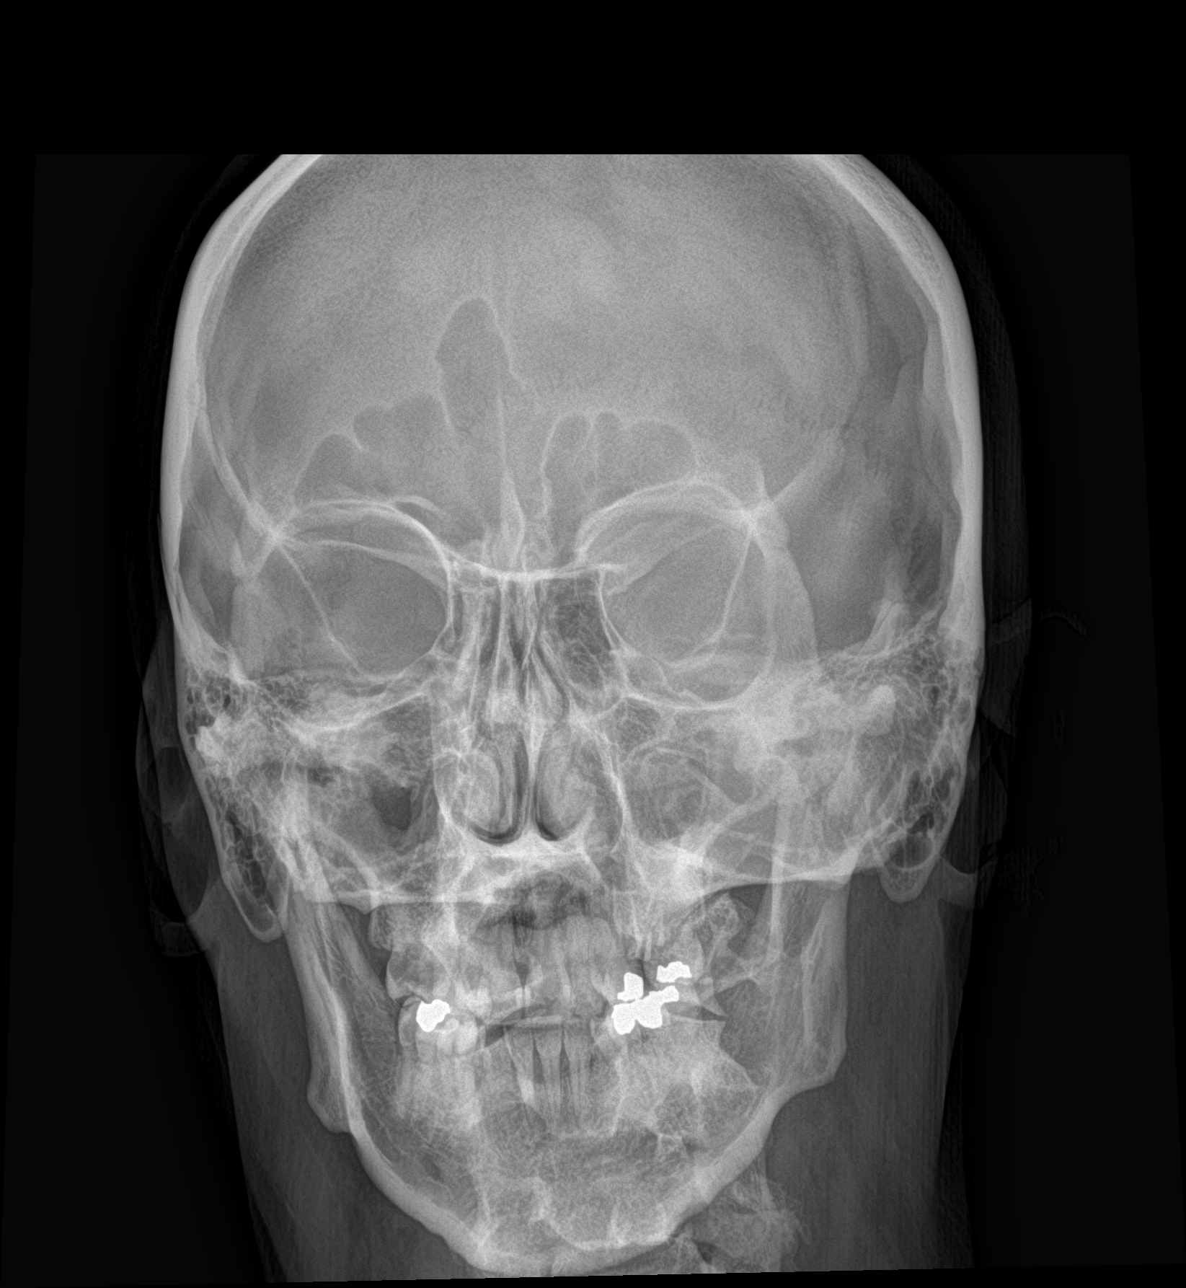

[[person_name] (3 of 3)]
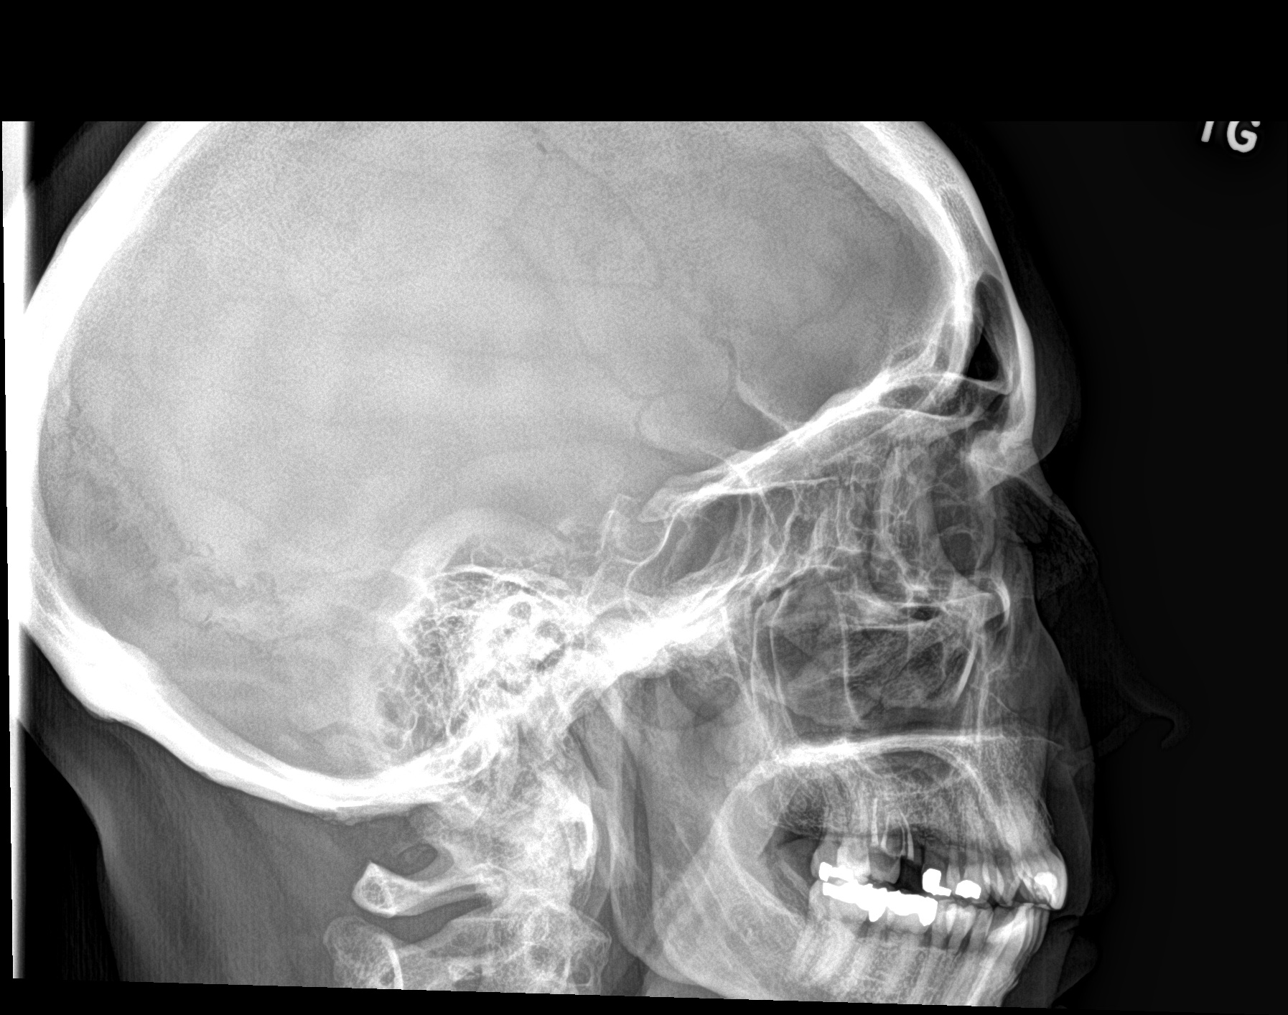

[facial smv]
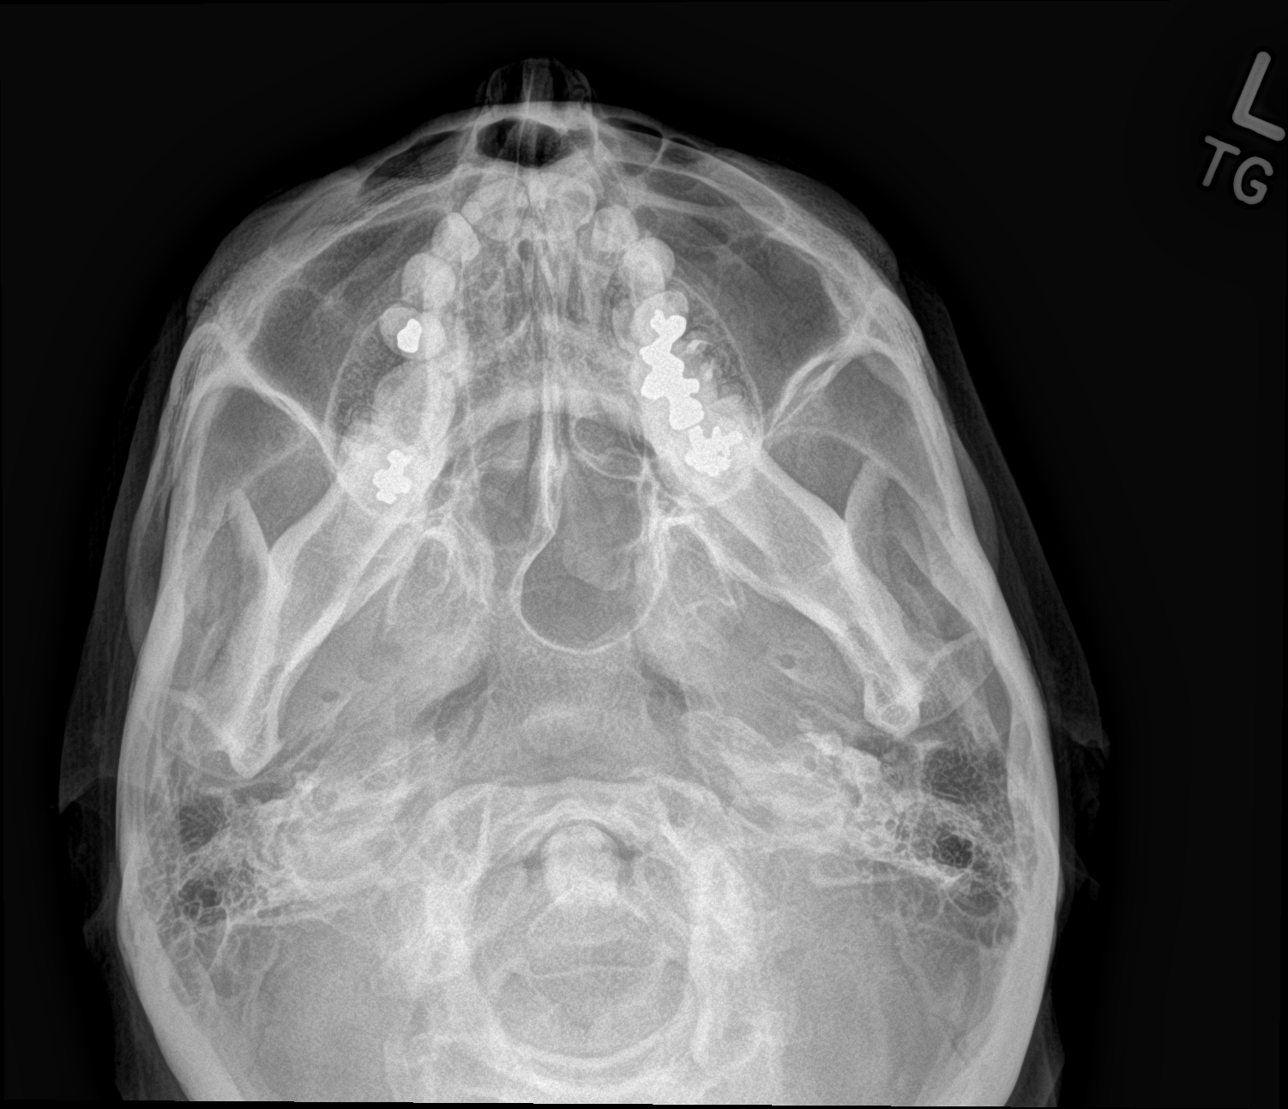

[4 of 4 positions shown; findings below may reference images not displayed]

FINDINGS: Frontal, water's, lateral, and submentovertex images were obtained.
Paranasal sinuses are clear. No air-fluid level. No bony destruction
or expansion. Mastoids also appear clear. There is slight leftward
deviation of the nasal septum.
IMPRESSION: Paranasal sinuses clear.  Slight leftward deviation of nasal septum.

## 2019-07-21 ENCOUNTER — Ambulatory Visit (INDEPENDENT_AMBULATORY_CARE_PROVIDER_SITE_OTHER): Payer: Commercial Managed Care - PPO | Admitting: Rehabilitative and Restorative Service Providers"

## 2019-07-21 ENCOUNTER — Other Ambulatory Visit: Payer: Self-pay

## 2019-07-21 ENCOUNTER — Encounter: Payer: Self-pay | Admitting: Rehabilitative and Restorative Service Providers"

## 2019-07-21 DIAGNOSIS — R29898 Other symptoms and signs involving the musculoskeletal system: Secondary | ICD-10-CM

## 2019-07-21 DIAGNOSIS — M5412 Radiculopathy, cervical region: Secondary | ICD-10-CM

## 2019-07-21 DIAGNOSIS — G540 Brachial plexus disorders: Secondary | ICD-10-CM

## 2019-07-21 DIAGNOSIS — M25521 Pain in right elbow: Secondary | ICD-10-CM

## 2019-07-21 DIAGNOSIS — R293 Abnormal posture: Secondary | ICD-10-CM | POA: Diagnosis not present

## 2019-07-21 NOTE — Patient Instructions (Signed)
Access Code: [email or web address]: [email or web address]: 04/06/2021Prepared by: Earvin Blazier HoltExercises  Supine Cervical Retraction with Towel - 2 x daily - 7 x weekly - 3 reps - 1 sets - 30 sec hold  Doorway Pec Stretch at 60 Degrees Abduction - 3 x daily - 7 x weekly - 3 reps - 1 sets  Doorway Pec Stretch at 90 Degrees Abduction - 3 x daily - 7 x weekly - 3 reps - 1 sets - 30 seconds hold  Doorway Pec Stretch at 120 Degrees Abduction - 3 x daily - 7 x weekly - 3 reps - 1 sets - 30 second hold hold Patient Education  TENS Unit

## 2019-07-21 NOTE — Therapy (Addendum)
[place] [address] [place], [place], [address] Phone: [phone]   Fax:  [phone]  Physical Therapy Evaluation  Patient Details  Name: Joyce Dominguez MRN: [id number] Date of Birth: 03/10/1966 Referring Provider (PT): Dr Joaquim Lai    Encounter Date: 07/21/2019  PT End of Session - 07/21/19 0858    Visit Number  1    Number of Visits  12    Date for PT Re-Evaluation  09/01/19    PT Start Time  0801    PT Stop Time  0858    PT Time Calculation (min)  57 min    Activity Tolerance  Patient tolerated treatment well       Past Medical History:  Diagnosis Date  . Migraine     Past Surgical History:  Procedure Laterality Date  . TUBAL LIGATION    . TUBAL LIGATION      There were no vitals filed for this visit.   Subjective Assessment - 07/21/19 0806    Subjective  Patient reports that she has been having pain in the Rt elbow for the past 6-8 weeks. She noticed decreased mobility of the shoulder. Xrays and MD exam indicated that her symptoms are related to neck problems. Noticed tingling in the Rt foot and Lt fingers on Sunday.    Pertinent History  LBP - "buldging disc"; DDD    Diagnostic tests  xrays    Patient Stated Goals  get rid of pain and whatever is going on    Currently in Pain?  Yes    Pain Score  2     Pain Location  Elbow    Pain Orientation  Right    Pain Descriptors / Indicators  Burning    Pain Type  Acute pain    Pain Radiating Towards  sometimes into Rt shoulder    Pain Onset  More than a month ago    Pain Frequency  Intermittent    Aggravating Factors   no idea    Pain Relieving Factors  nothing         [facility] PT Assessment - 07/21/19 0001      Assessment   Medical Diagnosis  Cervical radiculopathy     Referring Provider (PT)  Dr Joaquim Lai     Onset Date/Surgical Date  05/18/19    Hand Dominance  Right    Next MD Visit  PRN    Prior Therapy  for LBP ~ 5 yrs  ago       Precautions   Precautions  None      Balance Screen   Has the patient fallen in the past 6 months  No    Has the patient had a decrease in activity level because of a fear of falling?   No    Is the patient reluctant to leave their home because of a fear of falling?   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     Leisure  household chores; walking ~ 1 mile 4 x/wk; grandchildren       Observation/Other Assessments   Focus on Therapeutic Outcomes (FOTO)   48% limitation       Sensation   Additional Comments  None Rt UE       Posture/Postural Control   Posture Comments  head forward;  shoulders rounded and elevated; head of the humerus anterior in orientation       AROM   Overall AROM Comments  cervical tightness with lateral flexion and rotation     Right Shoulder Flexion  --   tight end range pain with eccentric lowering    Right Shoulder ABduction  --   tight end range no pain    Right Shoulder Internal Rotation  --   tight end range discomfort    Right Shoulder External Rotation  --   tight end range no pain    Right Elbow Flexion  --   full ROM pain    Right Elbow Extension  --   full ROM pain through biceps/elbow    Right/Left Forearm  --   WNL's bilat no pain    Cervical Flexion  54    Cervical Extension  47    Cervical - Right Side Bend  31    Cervical - Left Side Bend  28    Cervical - Right Rotation  47    Cervical - Left Rotation  55      Strength   Right/Left Shoulder  --   WFL's some discomfort with resisted shd flex   Right/Left Elbow  --   WFL's improved discomfort in elbow w/ resistance    Right/Left Forearm  --   WFL's no pain      Palpation   Spinal mobility  hypomobile cervical and thoracic spine with PA mobs     Palpation comment  muscular tightness through Rt upper quarter - pecs, upper trap; leveator; deltoid;  biceps; periscapular musculature       Special Tests    Special Tests  Thoracic Outlet Syndrome    Other special tests  (+) neural tension test Lt > Rt; pain and increased radicular symptoms with pressure through clavicular/1st rib area on Rt     Thoracic Outlet Syndrome   --   (+) test Rt with decreased pulse w/ elevation of Rt UE                Objective measurements completed on examination: See above findings.      [facility] Adult PT Treatment/Exercise - 07/21/19 0001      Neck Exercises: Supine   Neck Retraction  10 reps;5 secs      Shoulder Exercises: Stretch   Other Shoulder Stretches  doorway stretch 2 lower positions - 30 sec x 2 reps - unable to tolerate higher position       Manual Therapy   Joint Mobilization  1st rib; clavical     Soft tissue mobilization  through the Rt cervical and clavicular area         Moist heat and estim end of treatment x 15 min  Pt supine  E-stim - IFC to pt tolerance Rt cervical/shoudler girdle area to Rt arm/elbow Heat to Rt cervical and shoulder girdle to Rt elbow  area      PT Education - 07/21/19 0852    Education Details  HEP TENS POC    Person(s) Educated  Patient    Methods  Explanation;Demonstration;Tactile cues;Verbal cues;Handout    Comprehension  Verbalized understanding;Returned demonstration;Verbal cues required;Tactile cues required          PT Long Term Goals - 07/21/19 1142      PT LONG TERM GOAL #1   Title  Improve posture and alignemnt with patient to demonstrate improved upright posture with posterior shoulder girdle engaged  Time  6    Period  Weeks    Status  New    Target Date  09/01/19      PT LONG TERM GOAL #2   Title  Increase AROM cervical spine and Rt shoulder to WFL's and pain free    Time  6    Period  Weeks    Status  New    Target Date  09/01/19      PT LONG TERM GOAL #3   Title  Decrease pain in Rt elbow and shoudler by 75-90% allowing patient to return to all normal  functional activities    Time  6    Period  Weeks    Status  New    Target Date  09/01/19      PT LONG TERM GOAL #4   Title  Independent in HEP    Period  Weeks    Status  New    Target Date  09/01/19      PT LONG TERM GOAL #5   Title  Improve FOTO to </= 34% limtation    Time  6    Period  Weeks    Status  New    Target Date  09/01/19             Plan - 07/21/19 4034    Clinical Impression Statement  Patient presents with Rt elbow and shoulder pain with no known injury. She has intermittent pain in the elbow and radicular pain in the Rt shoulder area. Patient has (+) thoracic outlet test; abnormal posture and alignment; limited cervical mobility; muscular tightness to palpation; pain limitint functional activities. Patient will benefit from PT to address problems identified.    Stability/Clinical Decision Making  Stable/Uncomplicated    Clinical Decision Making  Low    Rehab Potential  Good    PT Frequency  2x / week    PT Duration  6 weeks    PT Treatment/Interventions  Patient/family education;ADLs/Self Care Home Management;Cryotherapy;Electrical Stimulation;Iontophoresis 4mg /ml Dexamethasone;Moist Heat;Ultrasound;Traction;Therapeutic activities;Therapeutic exercise;Neuromuscular re-education;Manual techniques;Taping;Passive range of motion;Dry needling    PT Next Visit Plan  review HEP and progress with postural exercises and nerve mobilization as tolerated; manual therapy including mobilization and soft tissue work; ; ergonomic education    PT Home Exercise Plan  [facility]    Consulted and Agree with Plan of Care  Patient       Patient will benefit from skilled therapeutic intervention in order to improve the following deficits and impairments:  Increased fascial restricitons, Impaired UE functional use, Pain, Hypomobility, Decreased mobility, Decreased strength, Postural dysfunction, Improper body mechanics  Visit Diagnosis: Radiculopathy, cervical  region  Thoracic outlet syndrome  Other symptoms and signs involving the musculoskeletal system  Abnormal posture  Pain in right elbow     Problem List There are no problems to display for this patient.   Meah Jiron [facility] PT, MPH  07/21/2019, 1:10 PM  [facility] [address] [place], [place], [place] Phone: [phone]   Fax:  [phone]  Name: Joyce Dominguez MRN: Earlene Plater Date of Birth: 01-21-1966

## 2019-07-22 NOTE — Addendum Note (Signed)
Addended by: Val Riles on: 07/22/2019 11:01 AM   Modules accepted: Orders

## 2019-07-23 ENCOUNTER — Other Ambulatory Visit: Payer: Self-pay

## 2019-07-23 ENCOUNTER — Ambulatory Visit (INDEPENDENT_AMBULATORY_CARE_PROVIDER_SITE_OTHER): Payer: Commercial Managed Care - PPO | Admitting: Physical Therapy

## 2019-07-23 DIAGNOSIS — R29898 Other symptoms and signs involving the musculoskeletal system: Secondary | ICD-10-CM

## 2019-07-23 DIAGNOSIS — G540 Brachial plexus disorders: Secondary | ICD-10-CM

## 2019-07-23 DIAGNOSIS — R293 Abnormal posture: Secondary | ICD-10-CM | POA: Diagnosis not present

## 2019-07-23 DIAGNOSIS — M5412 Radiculopathy, cervical region: Secondary | ICD-10-CM | POA: Diagnosis not present

## 2019-07-23 DIAGNOSIS — M25521 Pain in right elbow: Secondary | ICD-10-CM

## 2019-07-23 NOTE — Therapy (Signed)
[address] [address] [place], [place], [address] Phone: [phone]   Fax:  [phone]  Physical Therapy Treatment  Patient Details  Name: Joyce Dominguez MRN: [id number] Date of Birth: Sep 12, 1965 Referring Provider (PT): Dr Joaquim Lai    Encounter Date: 07/23/2019  PT End of Session - 07/23/19 1617    Visit Number  2    Number of Visits  12    Date for PT Re-Evaluation  09/01/19    PT Start Time  4268    PT Stop Time  1704    PT Time Calculation (min)  47 min    Activity Tolerance  Patient tolerated treatment well       Past Medical History:  Diagnosis Date  . Migraine     Past Surgical History:  Procedure Laterality Date  . TUBAL LIGATION    . TUBAL LIGATION      There were no vitals filed for this visit.  Subjective Assessment - 07/23/19 1617    Subjective  Pt stated that she was really sore yesterday. She has reported that she has "awful" pain in her right elbow (worse than the neck pain ) and that her neck is tight.    Currently in Pain?  Yes    Pain Score  3     Pain Location  Elbow    Pain Orientation  Posterior;Lateral    Pain Descriptors / Indicators  Aching;Tender    Pain Radiating Towards  radiating into right shoulder        [facility] Adult PT Treatment/Exercise - 07/23/19 0001      Self-Care   Self-Care  Other Self-Care Comments    Other Self-Care Comments   Pt educated on TENS safety, application and parameters. Pt verbalized understanding.       Neck Exercises: Standing   Other Standing Exercises  scap squeeze 5 sec x 5 reps      Neck Exercises: Supine   Neck Retraction  10 reps;5 secs    Neck Retraction Limitations  tactile cues for proper postition      Shoulder Exercises: Standing   Other Standing Exercises  L's and W's 5 sec x 5 reps; B shoulder rolls 3 reps 3 sets throughtout session      Shoulder Exercises: Stretch   Other Shoulder Stretches  door way stretch  low and middle 15 sec 2 reps      Modalities   Modalities  Moist Heat;Electrical Stimulation      Moist Heat Therapy   Number Minutes Moist Heat  10 Minutes    Moist Heat Location  Cervical;Elbow      Electrical Stimulation   Electrical Stimulation Location  Rt upper trap/ Rt lateral elbow    Electrical Stimulation Action  TENS    Electrical Stimulation Parameters  intensity to tolerance    Electrical Stimulation Goals  Pain      Neck Exercises: Stretches   Upper Trapezius Stretch  Right;2 reps;20 seconds    Other Neck Stretches  Lt lateral bending with self MFR to SCM x 15 sec x 2                   PT Long Term Goals - 07/21/19 1142      PT LONG TERM GOAL #1   Title  Improve posture and alignemnt with patient to demonstrate improved upright posture with posterior shoulder girdle engaged    Time  6    Period  Weeks  Status  New    Target Date  09/01/19      PT LONG TERM GOAL #2   Title  Increase AROM cervical spine and Rt shoulder to WFL's and pain free    Time  6    Period  Weeks    Status  New    Target Date  09/01/19      PT LONG TERM GOAL #3   Title  Decrease pain in Rt elbow and shoudler by 75-90% allowing patient to return to all normal functional activities    Time  6    Period  Weeks    Status  New    Target Date  09/01/19      PT LONG TERM GOAL #4   Title  Independent in HEP    Period  Weeks    Status  New    Target Date  09/01/19      PT LONG TERM GOAL #5   Title  Improve FOTO to </= 34% limtation    Time  6    Period  Weeks    Status  New    Target Date  09/01/19            Plan - 07/23/19 1714    Clinical Impression Statement  Pt is limited by Rt shoulder pain in high doorway pec stretch, postion was not tolerable. She repeatedly needed to shake out hands due to numbness increasing when hands were elevated. Shoulder rolls tolerated better after neck stretches.  No goals met yet; only 2nd visit.    Rehab Potential  Good    PT  Frequency  2x / week    PT Duration  6 weeks    PT Treatment/Interventions  Patient/family education;ADLs/Self Care Home Management;Cryotherapy;Electrical Stimulation;Iontophoresis 59m/ml Dexamethasone;Moist Heat;Ultrasound;Traction;Therapeutic activities;Therapeutic exercise;Neuromuscular re-education;Manual techniques;Taping;Passive range of motion;Dry needling    PT Next Visit Plan  progress with postural exercises and nerve mobilization as tolerated; manual therapy including mobilization and soft tissue work; pArts administrator ergonomic education    PT Home Exercise Plan  [place] Agree with Plan of Care  Patient       Patient will benefit from skilled therapeutic intervention in order to improve the following deficits and impairments:  Increased fascial restricitons, Impaired UE functional use, Pain, Hypomobility, Decreased mobility, Decreased strength, Postural dysfunction, Improper body mechanics  Visit Diagnosis: Radiculopathy, cervical region  Thoracic outlet syndrome  Other symptoms and signs involving the musculoskeletal system  Abnormal posture  Pain in right elbow     Problem List There are no problems to display for this patient.  JKerin Perna PTA 07/23/19 6:21 PM   [address] [address]Phone: [phone]  Fax:  [phone] Name: Joyce BohlmanMRN: [id number] of Birth: 502-13-1967

## 2019-07-24 ENCOUNTER — Encounter: Payer: BLUE CROSS/BLUE SHIELD | Admitting: Physical Therapy

## 2019-07-27 ENCOUNTER — Ambulatory Visit (INDEPENDENT_AMBULATORY_CARE_PROVIDER_SITE_OTHER): Payer: Commercial Managed Care - PPO | Admitting: Rehabilitative and Restorative Service Providers"

## 2019-07-27 ENCOUNTER — Encounter: Payer: Self-pay | Admitting: Rehabilitative and Restorative Service Providers"

## 2019-07-27 ENCOUNTER — Other Ambulatory Visit: Payer: Self-pay

## 2019-07-27 DIAGNOSIS — R293 Abnormal posture: Secondary | ICD-10-CM

## 2019-07-27 DIAGNOSIS — M5412 Radiculopathy, cervical region: Secondary | ICD-10-CM

## 2019-07-27 DIAGNOSIS — R29898 Other symptoms and signs involving the musculoskeletal system: Secondary | ICD-10-CM | POA: Diagnosis not present

## 2019-07-27 DIAGNOSIS — G540 Brachial plexus disorders: Secondary | ICD-10-CM | POA: Diagnosis not present

## 2019-07-27 DIAGNOSIS — M25521 Pain in right elbow: Secondary | ICD-10-CM

## 2019-07-27 NOTE — Patient Instructions (Signed)

## 2019-07-27 NOTE — Therapy (Signed)
[facility] [place] [address] [place], [place], [address] Phone: [phone]   Fax:  [phone]  Physical Therapy Treatment  Patient Details  Name: Joyce Dominguez MRN: [id number] Date of Birth: October 18, 1965 Referring Provider (PT): Dr Shirlee Limerick    Encounter Date: 07/27/2019  PT End of Session - 07/27/19 0800    Visit Number  3    Number of Visits  12    Date for PT Re-Evaluation  09/01/19    PT Start Time  0759    PT Stop Time  0854    PT Time Calculation (min)  55 min    Activity Tolerance  Patient tolerated treatment well       Past Medical History:  Diagnosis Date  . Migraine     Past Surgical History:  Procedure Laterality Date  . TUBAL LIGATION    . TUBAL LIGATION      There were no vitals filed for this visit.  Subjective Assessment - 07/27/19 0800    Subjective  Patient reports that she is feeling much better - the steroids have helped "a lot". She has done exercises at home. Making progress.    Currently in Pain?  Yes    Pain Score  1     Pain Location  Elbow    Pain Orientation  Right    Pain Descriptors / Indicators  Aching;Tender    Pain Type  Acute pain    Pain Onset  More than a month ago    Pain Frequency  Intermittent                       [facility] Adult PT Treatment/Exercise - 07/27/19 0001      Neck Exercises: Standing   Other Standing Exercises  scap squeeze 5 sec x 5 reps      Neck Exercises: Supine   Neck Retraction  10 reps;5 secs      Shoulder Exercises: Standing   Other Standing Exercises  scap squeexe 10 sec x 10; L's and W's 5 sec x 5 reps; B shoulder rolls 3 reps 3 sets throughtout session      Shoulder Exercises: Stretch   Other Shoulder Stretches  doorway stretch 3 positions 20 sec x 1 each position       Moist Heat Therapy   Number Minutes Moist Heat  15 Minutes    Moist Heat Location  Cervical;Elbow      Electrical Stimulation   Electrical  Stimulation Location  Rt cervical and clavicular area; Rt elbow     Electrical Stimulation Action  IFC    Electrical Stimulation Parameters  to tolerance    Electrical Stimulation Goals  Pain;Tone      Manual Therapy   Manual therapy comments  pt supine hooklying     Joint Mobilization  1st rib; clavical; cervical PA and lateral mobs     Soft tissue mobilization  deep tissue work through the ant/lat/posterior cervical and Rt clavicular area into the pecs; biceps; triceps; teres Rt side              PT Education - 07/27/19 0844    Education Details  DN; desk ergonomics    Person(s) Educated  Patient    Methods  Explanation;Handout    Comprehension  Verbalized understanding          PT Long Term Goals - 07/21/19 1142      PT LONG TERM GOAL #1   Title  Improve posture and alignemnt with patient to demonstrate improved upright posture with posterior shoulder girdle engaged    Time  6    Period  Weeks    Status  New    Target Date  09/01/19      PT LONG TERM GOAL #2   Title  Increase AROM cervical spine and Rt shoulder to WFL's and pain free    Time  6    Period  Weeks    Status  New    Target Date  09/01/19      PT LONG TERM GOAL #3   Title  Decrease pain in Rt elbow and shoudler by 75-90% allowing patient to return to all normal functional activities    Time  6    Period  Weeks    Status  New    Target Date  09/01/19      PT LONG TERM GOAL #4   Title  Independent in HEP    Period  Weeks    Status  New    Target Date  09/01/19      PT LONG TERM GOAL #5   Title  Improve FOTO to </= 34% limtation    Time  6    Period  Weeks    Status  New    Target Date  09/01/19            Plan - 07/27/19 0845    Clinical Impression Statement  Patient reports good improvement with symptoms due to medication. Note decreased palpable tightness but continue tissue restrictions through the Rt upper quarter. Responded well to manual work and modalities. Will benefit from  continued deep tissue work/manual techniques.    Rehab Potential  Good    PT Frequency  2x / week    PT Duration  6 weeks    PT Treatment/Interventions  Patient/family education;ADLs/Self Care Home Management;Cryotherapy;Electrical Stimulation;Iontophoresis 4mg /ml Dexamethasone;Moist Heat;Ultrasound;Traction;Therapeutic activities;Therapeutic exercise;Neuromuscular re-education;Manual techniques;Taping;Passive range of motion;Dry needling    PT Next Visit Plan  progress with postural exercises and nerve mobilization as tolerated; manual therapy including mobilization and soft tissue work; postural correction; ergonomic education - add TB posterior shoulder girdle exercises at next visit if she continues to improve    PT Home Exercise Plan  [place] and Agree with Plan of Care  Patient       Patient will benefit from skilled therapeutic intervention in order to improve the following deficits and impairments:     Visit Diagnosis: Radiculopathy, cervical region  Thoracic outlet syndrome  Other symptoms and signs involving the musculoskeletal system  Abnormal posture  Pain in right elbow     Problem List There are no problems to display for this patient.   [place], MPH  07/27/2019, 8:48 AM  [facility] [place], [place], [address] Phone: [phone]   Fax:  [phone]  Name: Joyce Dominguez MRN: [id number] Date of Birth: Mar 17, 1966

## 2019-07-30 ENCOUNTER — Ambulatory Visit (INDEPENDENT_AMBULATORY_CARE_PROVIDER_SITE_OTHER): Payer: Commercial Managed Care - PPO | Admitting: Physical Therapy

## 2019-07-30 ENCOUNTER — Other Ambulatory Visit: Payer: Self-pay

## 2019-07-30 DIAGNOSIS — M25521 Pain in right elbow: Secondary | ICD-10-CM

## 2019-07-30 DIAGNOSIS — R293 Abnormal posture: Secondary | ICD-10-CM | POA: Diagnosis not present

## 2019-07-30 DIAGNOSIS — R29898 Other symptoms and signs involving the musculoskeletal system: Secondary | ICD-10-CM | POA: Diagnosis not present

## 2019-07-30 DIAGNOSIS — G540 Brachial plexus disorders: Secondary | ICD-10-CM | POA: Diagnosis not present

## 2019-07-30 DIAGNOSIS — M5412 Radiculopathy, cervical region: Secondary | ICD-10-CM | POA: Diagnosis not present

## 2019-07-30 NOTE — Therapy (Signed)
[place], [place], [address] Phone: [phone]   Fax:  [phone]  Physical Therapy Treatment  Patient Details  Name: Joyce Dominguez MRN: [id number] Date of Birth: [birth year] Referring Provider (PT): Dr Joaquim Lai    Encounter Date: 07/30/2019  PT End of Session - 07/30/19 1650    Visit Number  4    Number of Visits  12    Date for PT Re-Evaluation  09/01/19    PT Start Time  4008    PT Stop Time  1705    PT Time Calculation (min)  48 min    Activity Tolerance  Patient tolerated treatment well    Behavior During Therapy  [facility] for tasks assessed/performed       Past Medical History:  Diagnosis Date  . Migraine     Past Surgical History:  Procedure Laterality Date  . TUBAL LIGATION    . TUBAL LIGATION      There were no vitals filed for this visit.  Subjective Assessment - 07/30/19 1709    Subjective  Pt reports that she has been under a lot of stress lately and not sleeping due to taking steroid medication. She has been keeping up with her HEP and states that she loves the new additions from Monday 07/27/19.    Pertinent History  LBP - "buldging disc"; DDD    Diagnostic tests  xrays    Currently in Pain?  Yes    Pain Score  3     Pain Location  Neck    Pain Orientation  Right;Posterior;Lateral    Pain Descriptors / Indicators  Discomfort;Shooting    Pain Radiating Towards  radiating into Rt shoulder towards elbow    Aggravating Factors   no idea    Pain Relieving Factors  nothing       [facility] Adult PT Treatment/Exercise - 07/30/19 0001      Neck Exercises: Machines for Strengthening   UBE (Upper Arm Bike)  L1; 1 min forward, 1 min backwards      Neck Exercises: Standing   Other Standing Exercises  --      Neck Exercises: Supine   Cervical Isometrics  Extension;5 secs;5 reps   head press     Shoulder Exercises: Prone   Other Prone Exercises  On elbows; scapular  retraction x 3, 10 seconds;  Axial ext x 5 sec hold x 5 reps; repeated with scap retraction x 5 reps ( stopped due to pt reported feeling claustraphobic in this position)      Shoulder Exercises: Sidelying   Other Sidelying Exercises  open book 10 reps 5 sec hold each side      Shoulder Exercises: Standing   Other Standing Exercises  scap squeeze 10 sec x 10; L's 10 sec x 10 reps:W's x5;sec hold    Other Standing Exercises  --      Shoulder Exercises: Stretch   Other Shoulder Stretches  modified downward dog at wall  20 sec holds   hands flat on wall x 1; pinky side on wall x 2      Manual Therapy   Manual Therapy  Myofascial release    Soft tissue mobilization  STM to bilat cervical paraspinals, Rt scalenes, Rt levator, bilat upper trap, temporalis muscles    Myofascial Release  MFR to Rt scalenes and suboccipitals.         PT Long Term Goals - 07/30/19 1803  PT LONG TERM GOAL #1   Title  Improve posture and alignemnt with patient to demonstrate improved upright posture with posterior shoulder girdle engaged    Time  6    Period  Weeks    Status  New      PT LONG TERM GOAL #2   Title  Increase AROM cervical spine and Rt shoulder to WFL's and pain free    Time  6    Period  Weeks    Status  New      PT LONG TERM GOAL #3   Title  Decrease pain in Rt elbow and shoudler by 75-90% allowing patient to return to all normal functional activities    Time  6    Period  Weeks    Status  New      PT LONG TERM GOAL #4   Title  Independent in HEP    Period  Weeks    Status  New      PT LONG TERM GOAL #5   Title  Improve FOTO to </= 34% limtation    Time  6    Period  Weeks    Status  New            Plan - 07/30/19 1804    Clinical Impression Statement  Pt reports a decrease in neck pain due to being on a short round of prednisone. Note increased palapble tightness in Rt scalenes; responded well to manual work. She declined use of modalities due to ability to use  TENs at home. Will benefit from stretching of the neck and deep tissue work/manual  techniques.    Rehab Potential  Good    PT Frequency  2x / week    PT Duration  6 weeks    PT Treatment/Interventions  Patient/family education;ADLs/Self Care Home Management;Cryotherapy;Electrical Stimulation;Iontophoresis 4mg /ml Dexamethasone;Moist Heat;Ultrasound;Traction;Therapeutic activities;Therapeutic exercise;Neuromuscular re-education;Manual techniques;Taping;Passive range of motion;Dry needling    PT Next Visit Plan  progress with postural exercises and nerve mobilization as tolerated; manual therapy including mobilization and soft tissue work; postural correction; ergonomic education - add TB posterior shoulder girdle exercises at next visit if she continues to improve    PT Home Exercise Plan  [facility]    Consulted and Agree with Plan of Care  Patient       Patient will benefit from skilled therapeutic intervention in order to improve the following deficits and impairments:  Increased fascial restricitons, Impaired UE functional use, Pain, Hypomobility, Decreased mobility, Decreased strength, Postural dysfunction, Improper body mechanics  Visit Diagnosis: Radiculopathy, cervical region  Thoracic outlet syndrome  Other symptoms and signs involving the musculoskeletal system  Abnormal posture  Pain in right elbow     Problem List There are no problems to display for this patient.   [facility], SPTA 07/30/19 6:08 PM  08/01/19, PTA 07/30/19 6:36 PM  [facility] [place] [address] [place], [place], [place] Phone: [phone]   Fax:  [phone]  Name: Ndea Kilroy MRN: Joyce Dominguez Date of Birth: Mar 21, 1966

## 2019-07-31 ENCOUNTER — Encounter: Payer: BLUE CROSS/BLUE SHIELD | Admitting: Physical Therapy

## 2019-08-03 ENCOUNTER — Ambulatory Visit: Payer: Commercial Managed Care - PPO | Admitting: Physical Therapy

## 2019-08-03 ENCOUNTER — Other Ambulatory Visit: Payer: Self-pay

## 2019-08-03 ENCOUNTER — Encounter: Payer: Self-pay | Admitting: Physical Therapy

## 2019-08-03 DIAGNOSIS — R29898 Other symptoms and signs involving the musculoskeletal system: Secondary | ICD-10-CM | POA: Diagnosis not present

## 2019-08-03 DIAGNOSIS — G540 Brachial plexus disorders: Secondary | ICD-10-CM | POA: Diagnosis not present

## 2019-08-03 DIAGNOSIS — M5412 Radiculopathy, cervical region: Secondary | ICD-10-CM

## 2019-08-03 DIAGNOSIS — M25521 Pain in right elbow: Secondary | ICD-10-CM

## 2019-08-03 DIAGNOSIS — R293 Abnormal posture: Secondary | ICD-10-CM

## 2019-08-03 NOTE — Therapy (Signed)
[facility] [place] [address] [place], [place], [address] Phone: [phone]   Fax:  [phone]  Physical Therapy Treatment  Patient Details  Name: Joyce Dominguez MRN: [id number] Date of Birth: 1965-10-11 Referring Provider (PT): Dr Shirlee Limerick    Encounter Date: 08/03/2019  PT End of Session - 08/03/19 0758    Visit Number  5    Number of Visits  12    Date for PT Re-Evaluation  09/01/19    PT Start Time  0801    PT Stop Time  0843    PT Time Calculation (min)  42 min    Activity Tolerance  Patient tolerated treatment well    Behavior During Therapy  [facility] for tasks assessed/performed       Past Medical History:  Diagnosis Date  . Migraine     Past Surgical History:  Procedure Laterality Date  . TUBAL LIGATION    . TUBAL LIGATION      There were no vitals filed for this visit.  Subjective Assessment - 08/03/19 0800    Subjective  Pt reports her neck is "killing her".  She woke up some pain across lower neck, worsens with turning head to the right.  Her Rt arm and elbow feel good.  She was able to do yardwork (planting flowers) without increase in pain.  She finished round of prednisone on Saturday. She has used her TENS unit over weekend.    Pertinent History  LBP - "buldging disc"; DDD    Diagnostic tests  xrays    Patient Stated Goals  get rid of pain and whatever is going on    Currently in Pain?  Yes    Pain Score  4     Pain Location  Neck    Pain Orientation  Right;Left;Posterior    Pain Descriptors / Indicators  Tightness    Aggravating Factors   turning head    Pain Relieving Factors  nothing         [facility] PT Assessment - 08/03/19 0001      Assessment   Medical Diagnosis  Cervical radiculopathy     Referring Provider (PT)  Dr Shirlee Limerick     Onset Date/Surgical Date  05/18/19    Hand Dominance  Right    Next MD Visit  PRN    Prior Therapy  for LBP ~ 5 yrs ago       AROM   Cervical Flexion  55    Cervical Extension  55    Cervical - Right Side Bend  38    Cervical - Left Side Bend  41    Cervical - Right Rotation  59    Cervical - Left Rotation  53       [facility] Adult PT Treatment/Exercise - 08/03/19 0001      Neck Exercises: Seated   Cervical Rotation  Right;Left;5 reps    Lateral Flexion  Right;Left;5 reps      Shoulder Exercises: Supine   Horizontal ABduction  Both;10 reps;Strengthening    Theraband Level (Shoulder Horizontal ABduction)  Level 1 (Yellow)    External Rotation  Strengthening;Both;5 reps    Theraband Level (Shoulder External Rotation)  Level 1 (Yellow)    Flexion  Both;10 reps;Strengthening   bilat overhead pull   Theraband Level (Shoulder Flexion)  Level 1 (Yellow)      Shoulder Exercises: Seated   Row  --    Theraband Level (Shoulder Row)  --  External Rotation  Both;10 reps;Strengthening    Theraband Level (Shoulder External Rotation)  Level 1 (Yellow)      Shoulder Exercises: Standing   Row  Strengthening;Both;10 reps;Theraband    Theraband Level (Shoulder Row)  Level 1 (Yellow)      Shoulder Exercises: ROM/Strengthening   UBE (Upper Arm Bike)  L2: 1 min forward/ 1 min backward, standing       Shoulder Exercises: Stretch   Other Shoulder Stretches  doorway stretch 3 positions 20 sec x 2 each position     Other Shoulder Stretches  Rt tricep stretch with door frame assist x 20 sec, bilat bicep stretch x 20 sec      Manual Therapy   Soft tissue mobilization  IASTM to bilat upper trap, scalene, upper thoracic and lower cervical paraspinals, levator,  Rt prox wrist extensors, Rt distal tricep, Rt bicep - to decrease fascial restrictions and improve ROM.                   PT Long Term Goals - 07/30/19 1803      PT LONG TERM GOAL #1   Title  Improve posture and alignemnt with patient to demonstrate improved upright posture with posterior shoulder girdle engaged    Time  6    Period  Weeks    Status  New       PT LONG TERM GOAL #2   Title  Increase AROM cervical spine and Rt shoulder to WFL's and pain free    Time  6    Period  Weeks    Status  New      PT LONG TERM GOAL #3   Title  Decrease pain in Rt elbow and shoudler by 75-90% allowing patient to return to all normal functional activities    Time  6    Period  Weeks    Status  New      PT LONG TERM GOAL #4   Title  Independent in HEP    Period  Weeks    Status  New      PT LONG TERM GOAL #5   Title  Improve FOTO to </= 34% limtation    Time  6    Period  Weeks    Status  New            Plan - 08/03/19 9242    Clinical Impression Statement  Positive response to IASTM to neck and elbow; improved ROM and reported less pain afterwards. Pt tolerated new exercises with light resistance well; added to her HEP.   Pt had palpable tightness in Rt wrist extensors and distal Rt lateral tricep, along with bilat upper trap and Lt scalene; improved with IASTM.  Pt interested in DN in upcoming visit.    Rehab Potential  Good    PT Frequency  2x / week    PT Duration  6 weeks    PT Treatment/Interventions  Patient/family education;ADLs/Self Care Home Management;Cryotherapy;Electrical Stimulation;Iontophoresis 4mg /ml Dexamethasone;Moist Heat;Ultrasound;Traction;Therapeutic activities;Therapeutic exercise;Neuromuscular re-education;Manual techniques;Taping;Passive range of motion;Dry needling    PT Next Visit Plan  DN/manual therapy to RUE, bilat neck. continue postural strengthening.    PT Home Exercise Plan  [facility]    Consulted and Agree with Plan of Care  Patient       Patient will benefit from skilled therapeutic intervention in order to improve the following deficits and impairments:  Increased fascial restricitons, Impaired UE functional use, Pain, Hypomobility, Decreased mobility, Decreased strength, Postural dysfunction, Improper  body mechanics  Visit Diagnosis: Radiculopathy, cervical region  Thoracic outlet syndrome  Other  symptoms and signs involving the musculoskeletal system  Abnormal posture  Pain in right elbow     Problem List There are no problems to display for this patient.  Kerin Perna, PTA 08/03/19 9:16 AM  [facility] [place], [place], [address] Phone: [phone]   Fax:  [phone]  Name: Winry Egnew MRN: [id number] Date of Birth: 04/22/1965

## 2019-08-03 NOTE — Patient Instructions (Signed)

## 2019-08-06 ENCOUNTER — Other Ambulatory Visit: Payer: Self-pay

## 2019-08-06 ENCOUNTER — Encounter: Payer: Self-pay | Admitting: Rehabilitative and Restorative Service Providers"

## 2019-08-06 ENCOUNTER — Ambulatory Visit (INDEPENDENT_AMBULATORY_CARE_PROVIDER_SITE_OTHER): Payer: Commercial Managed Care - PPO | Admitting: Rehabilitative and Restorative Service Providers"

## 2019-08-06 DIAGNOSIS — R29898 Other symptoms and signs involving the musculoskeletal system: Secondary | ICD-10-CM | POA: Diagnosis not present

## 2019-08-06 DIAGNOSIS — M5412 Radiculopathy, cervical region: Secondary | ICD-10-CM | POA: Diagnosis not present

## 2019-08-06 DIAGNOSIS — R293 Abnormal posture: Secondary | ICD-10-CM | POA: Diagnosis not present

## 2019-08-06 DIAGNOSIS — G540 Brachial plexus disorders: Secondary | ICD-10-CM | POA: Diagnosis not present

## 2019-08-06 DIAGNOSIS — M25521 Pain in right elbow: Secondary | ICD-10-CM

## 2019-08-06 NOTE — Patient Instructions (Signed)

## 2019-08-06 NOTE — Therapy (Addendum)
[place], [place], [address] Phone: [phone]   Fax:  [phone]  Physical Therapy Treatment  Patient Details  Name: Carigan Lister MRN: [id number] Date of Birth: 22-Jan-1966 Referring Provider (PT): Dr Joaquim Lai    Encounter Date: 08/06/2019  PT End of Session - 08/06/19 1537    Visit Number  6    Number of Visits  12    Date for PT Re-Evaluation  09/01/19    PT Start Time  5462    PT Stop Time  1625   moist heat end of treatment   PT Time Calculation (min)  50 min    Activity Tolerance  Patient tolerated treatment well       Past Medical History:  Diagnosis Date  . Migraine     Past Surgical History:  Procedure Laterality Date  . TUBAL LIGATION    . TUBAL LIGATION      There were no vitals filed for this visit.  Subjective Assessment - 08/06/19 1537    Subjective  Patient reports that she is still having some pulling and pain in the Rt side of the neck and into the shoulder and arm "a little bit". Better since last treatment. Can feel a difference in the tightness through the neck area following DN/manual work    Currently in Pain?  Yes    Pain Score  2     Pain Location  Neck    Pain Descriptors / Indicators  Tightness    Pain Type  Acute pain    Pain Radiating Towards  less ratiating into the Rt shoulder to elbow    Pain Onset  More than a month ago    Pain Frequency  Intermittent                       [facility] Adult PT Treatment/Exercise - 08/06/19 0001      Manual Therapy   Manual therapy comments  skilled palpation for tissue changes with DN     Joint Mobilization  1st rib; clavical; cervical PA and lateral mobs     Soft tissue mobilization  deep tissue work through the the ant/lat/posterior cervical musculature; upper trap; clavicular area; Rt biceps/triceps; extensor forearm     Myofascial Release  Rt cervical to upper trap to       Neck  Exercises: Stretches   Other Neck Stretches  axial extension in supine 10 sec hold x 5; gentle nodding yes/no x 5 reps each        Trigger Point Dry Needling - 08/06/19 0001    Consent Given?  Yes    Education Handout Provided  Yes    Muscles Treated Head and Neck  Scalenes;Cervical multifidi    Other Dry Needling  bilat Rt > Lt     Scalenes Response  Palpable increased muscle length   small bruise noted Rt scaleni area    Cervical multifidi Response  Palpable increased muscle length           PT Education - 08/06/19 1627    Education Details  DN    Person(s) Educated  Patient    Methods  Explanation    Comprehension  Verbalized understanding          PT Long Term Goals - 07/30/19 1803      PT LONG TERM GOAL #1   Title  Improve posture and alignemnt with patient to demonstrate improved upright posture  with posterior shoulder girdle engaged    Time  6    Period  Weeks    Status  New      PT LONG TERM GOAL #2   Title  Increase AROM cervical spine and Rt shoulder to WFL's and pain free    Time  6    Period  Weeks    Status  New      PT LONG TERM GOAL #3   Title  Decrease pain in Rt elbow and shoudler by 75-90% allowing patient to return to all normal functional activities    Time  6    Period  Weeks    Status  New      PT LONG TERM GOAL #4   Title  Independent in HEP    Period  Weeks    Status  New      PT LONG TERM GOAL #5   Title  Improve FOTO to </= 34% limtation    Time  6    Period  Weeks    Status  New            Plan - 08/06/19 1628    Clinical Impression Statement  Good improvement in symptoms with decreased pain and tightness in the neck and Rt UE following IASTM work at last visit. Patient continues to have palpable tightness through the Rt > Lt cervical musculature - ant/lat/post scaleni; upper trap; leveator; clavicular area; pecs. Good response following DN/manual work with decreased muscular tightness to palpation and improved mobility  per pt report.    Rehab Potential  Good    PT Frequency  2x / week    PT Duration  6 weeks    PT Treatment/Interventions  Patient/family education;ADLs/Self Care Home Management;Cryotherapy;Electrical Stimulation;Iontophoresis 58m/ml Dexamethasone;Moist Heat;Ultrasound;Traction;Therapeutic activities;Therapeutic exercise;Neuromuscular re-education;Manual techniques;Taping;Passive range of motion;Dry needling    PT Next Visit Plan  assess response to DN/manual therapy bilat cervical musculature; Rt upper quadrant, continue postural strengthening.    [facility]   Consulted and Agree with Plan of Care  Patient       Patient will benefit from skilled therapeutic intervention in order to improve the following deficits and impairments:     Visit Diagnosis: Radiculopathy, cervical region  Thoracic outlet syndrome  Other symptoms and signs involving the musculoskeletal system  Abnormal posture  Pain in right elbow     Problem List There are no problems to display for this patient.   Presten Joost PNilda SimmerPT, MPH  08/06/2019, 4:33 PM  [facility] [address] [address]Phone: [phone]  Fax:  [phone] Name: KDail MeeceMRN: [id number] of Birth: [birth year] PHYSICAL THERAPY DISCHARGE SUMMARY  Visits from Start of Care: 5  Current functional level related to goals / functional outcomes: See progress note for discharge status    Remaining deficits: Unknown    Education / Equipment: HEP  Plan: Patient agrees to discharge.  Patient goals were partially met. Patient is being discharged due to not returning since the last visit.  ?????    Blasa Raisch P. HHelene KelpPT, MPH 10/15/19 5:57 PM

## 2019-08-17 ENCOUNTER — Encounter: Payer: Self-pay | Admitting: Rehabilitative and Restorative Service Providers"
